# Patient Record
Sex: Female | Born: 1950
Health system: Southern US, Community
[De-identification: ages and names within clinical notes are randomized; demographics above are authoritative.]

## PROBLEM LIST (undated history)

## (undated) DIAGNOSIS — K635 Polyp of colon: Secondary | ICD-10-CM

## (undated) DIAGNOSIS — N9489 Other specified conditions associated with female genital organs and menstrual cycle: Secondary | ICD-10-CM

## (undated) DIAGNOSIS — M8580 Other specified disorders of bone density and structure, unspecified site: Secondary | ICD-10-CM

## (undated) DIAGNOSIS — I10 Essential (primary) hypertension: Secondary | ICD-10-CM

## (undated) DIAGNOSIS — E785 Hyperlipidemia, unspecified: Secondary | ICD-10-CM

## (undated) DIAGNOSIS — M1990 Unspecified osteoarthritis, unspecified site: Secondary | ICD-10-CM

## (undated) DIAGNOSIS — N83201 Unspecified ovarian cyst, right side: Secondary | ICD-10-CM

## (undated) DIAGNOSIS — T8859XA Other complications of anesthesia, initial encounter: Secondary | ICD-10-CM

## (undated) DIAGNOSIS — E119 Type 2 diabetes mellitus without complications: Secondary | ICD-10-CM

## (undated) DIAGNOSIS — I493 Ventricular premature depolarization: Secondary | ICD-10-CM

## (undated) DIAGNOSIS — Z87410 Personal history of cervical dysplasia: Secondary | ICD-10-CM

## (undated) DIAGNOSIS — Z8719 Personal history of other diseases of the digestive system: Secondary | ICD-10-CM

## (undated) DIAGNOSIS — K219 Gastro-esophageal reflux disease without esophagitis: Secondary | ICD-10-CM

## (undated) HISTORY — DX: Ventricular premature depolarization: I49.3

## (undated) HISTORY — DX: Hyperlipidemia, unspecified: E78.5

## (undated) HISTORY — DX: Essential (primary) hypertension: I10

## (undated) HISTORY — PX: CARDIAC ELECTROPHYSIOLOGY STUDY AND ABLATION: SHX1294

## (undated) HISTORY — DX: Personal history of cervical dysplasia: Z87.410

## (undated) HISTORY — DX: Polyp of colon: K63.5

## (undated) HISTORY — PX: APPENDECTOMY: SHX54

## (undated) HISTORY — PX: TONSILLECTOMY: SUR1361

## (undated) HISTORY — DX: Other specified conditions associated with female genital organs and menstrual cycle: N94.89

## (undated) HISTORY — DX: Type 2 diabetes mellitus without complications: E11.9

## (undated) HISTORY — DX: Other specified disorders of bone density and structure, unspecified site: M85.80

---
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
## 1994-01-11 HISTORY — PX: TUBAL LIGATION: SHX77

## 2017-02-21 DIAGNOSIS — E1169 Type 2 diabetes mellitus with other specified complication: Secondary | ICD-10-CM | POA: Diagnosis not present

## 2017-02-21 DIAGNOSIS — E119 Type 2 diabetes mellitus without complications: Secondary | ICD-10-CM | POA: Diagnosis not present

## 2017-02-21 DIAGNOSIS — I10 Essential (primary) hypertension: Secondary | ICD-10-CM | POA: Diagnosis not present

## 2017-06-22 DIAGNOSIS — I10 Essential (primary) hypertension: Secondary | ICD-10-CM | POA: Diagnosis not present

## 2017-06-22 DIAGNOSIS — Z0000 Encounter for general adult medical examination without abnormal findings: Secondary | ICD-10-CM | POA: Diagnosis not present

## 2017-06-22 DIAGNOSIS — E7800 Pure hypercholesterolemia, unspecified: Secondary | ICD-10-CM | POA: Diagnosis not present

## 2017-06-22 DIAGNOSIS — Z23 Encounter for immunization: Secondary | ICD-10-CM | POA: Diagnosis not present

## 2017-06-22 DIAGNOSIS — E1169 Type 2 diabetes mellitus with other specified complication: Secondary | ICD-10-CM | POA: Diagnosis not present

## 2017-06-22 DIAGNOSIS — Z124 Encounter for screening for malignant neoplasm of cervix: Secondary | ICD-10-CM | POA: Diagnosis not present

## 2017-06-24 DIAGNOSIS — Z807 Family history of other malignant neoplasms of lymphoid, hematopoietic and related tissues: Secondary | ICD-10-CM | POA: Diagnosis not present

## 2017-06-24 DIAGNOSIS — R928 Other abnormal and inconclusive findings on diagnostic imaging of breast: Secondary | ICD-10-CM | POA: Diagnosis not present

## 2017-06-24 DIAGNOSIS — Z1231 Encounter for screening mammogram for malignant neoplasm of breast: Secondary | ICD-10-CM | POA: Diagnosis not present

## 2017-06-28 DIAGNOSIS — R928 Other abnormal and inconclusive findings on diagnostic imaging of breast: Secondary | ICD-10-CM | POA: Diagnosis not present

## 2017-09-05 DIAGNOSIS — L255 Unspecified contact dermatitis due to plants, except food: Secondary | ICD-10-CM | POA: Diagnosis not present

## 2017-09-05 DIAGNOSIS — M7071 Other bursitis of hip, right hip: Secondary | ICD-10-CM | POA: Diagnosis not present

## 2017-10-05 DIAGNOSIS — I10 Essential (primary) hypertension: Secondary | ICD-10-CM | POA: Diagnosis not present

## 2017-10-05 DIAGNOSIS — Z23 Encounter for immunization: Secondary | ICD-10-CM | POA: Diagnosis not present

## 2017-10-05 DIAGNOSIS — E785 Hyperlipidemia, unspecified: Secondary | ICD-10-CM | POA: Diagnosis not present

## 2017-10-05 DIAGNOSIS — E1140 Type 2 diabetes mellitus with diabetic neuropathy, unspecified: Secondary | ICD-10-CM | POA: Diagnosis not present

## 2017-12-19 DIAGNOSIS — K219 Gastro-esophageal reflux disease without esophagitis: Secondary | ICD-10-CM | POA: Diagnosis not present

## 2017-12-19 DIAGNOSIS — Z86010 Personal history of colonic polyps: Secondary | ICD-10-CM | POA: Diagnosis not present

## 2017-12-19 DIAGNOSIS — R194 Change in bowel habit: Secondary | ICD-10-CM | POA: Diagnosis not present

## 2017-12-22 DIAGNOSIS — Z86010 Personal history of colonic polyps: Secondary | ICD-10-CM | POA: Diagnosis not present

## 2017-12-22 DIAGNOSIS — R197 Diarrhea, unspecified: Secondary | ICD-10-CM | POA: Diagnosis not present

## 2017-12-22 DIAGNOSIS — D123 Benign neoplasm of transverse colon: Secondary | ICD-10-CM | POA: Diagnosis not present

## 2017-12-22 DIAGNOSIS — K5730 Diverticulosis of large intestine without perforation or abscess without bleeding: Secondary | ICD-10-CM | POA: Diagnosis not present

## 2017-12-22 DIAGNOSIS — R194 Change in bowel habit: Secondary | ICD-10-CM | POA: Diagnosis not present

## 2017-12-27 DIAGNOSIS — D123 Benign neoplasm of transverse colon: Secondary | ICD-10-CM | POA: Diagnosis not present

## 2018-02-06 DIAGNOSIS — D3132 Benign neoplasm of left choroid: Secondary | ICD-10-CM | POA: Diagnosis not present

## 2018-02-06 DIAGNOSIS — H2513 Age-related nuclear cataract, bilateral: Secondary | ICD-10-CM | POA: Diagnosis not present

## 2018-02-06 DIAGNOSIS — E119 Type 2 diabetes mellitus without complications: Secondary | ICD-10-CM | POA: Diagnosis not present

## 2018-02-13 DIAGNOSIS — Z1389 Encounter for screening for other disorder: Secondary | ICD-10-CM | POA: Diagnosis not present

## 2018-02-13 DIAGNOSIS — Z7984 Long term (current) use of oral hypoglycemic drugs: Secondary | ICD-10-CM | POA: Diagnosis not present

## 2018-02-13 DIAGNOSIS — E1169 Type 2 diabetes mellitus with other specified complication: Secondary | ICD-10-CM | POA: Diagnosis not present

## 2018-02-13 DIAGNOSIS — I10 Essential (primary) hypertension: Secondary | ICD-10-CM | POA: Diagnosis not present

## 2018-02-13 DIAGNOSIS — I493 Ventricular premature depolarization: Secondary | ICD-10-CM | POA: Diagnosis not present

## 2018-02-13 DIAGNOSIS — E7800 Pure hypercholesterolemia, unspecified: Secondary | ICD-10-CM | POA: Diagnosis not present

## 2018-02-14 ENCOUNTER — Other Ambulatory Visit: Payer: Self-pay | Admitting: Internal Medicine

## 2018-02-14 DIAGNOSIS — Z1231 Encounter for screening mammogram for malignant neoplasm of breast: Secondary | ICD-10-CM

## 2018-02-20 DIAGNOSIS — R194 Change in bowel habit: Secondary | ICD-10-CM | POA: Diagnosis not present

## 2018-02-20 DIAGNOSIS — Z86010 Personal history of colonic polyps: Secondary | ICD-10-CM | POA: Diagnosis not present

## 2018-06-30 ENCOUNTER — Ambulatory Visit
Admission: RE | Admit: 2018-06-30 | Discharge: 2018-06-30 | Disposition: A | Discharge status: Home / Self Care | Payer: Medicare Other | Source: Ambulatory Visit | Attending: Internal Medicine | Admitting: Internal Medicine

## 2018-06-30 ENCOUNTER — Other Ambulatory Visit: Payer: Self-pay

## 2018-06-30 ENCOUNTER — Other Ambulatory Visit: Payer: Self-pay | Admitting: Obstetrics and Gynecology

## 2018-06-30 ENCOUNTER — Other Ambulatory Visit (HOSPITAL_COMMUNITY)
Admission: RE | Admit: 2018-06-30 | Discharge: 2018-06-30 | Disposition: A | Discharge status: Home / Self Care | Payer: Medicare Other | Source: Ambulatory Visit | Attending: Obstetrics and Gynecology | Admitting: Obstetrics and Gynecology

## 2018-06-30 DIAGNOSIS — Z01419 Encounter for gynecological examination (general) (routine) without abnormal findings: Secondary | ICD-10-CM | POA: Insufficient documentation

## 2018-06-30 DIAGNOSIS — Z1231 Encounter for screening mammogram for malignant neoplasm of breast: Secondary | ICD-10-CM | POA: Diagnosis not present

## 2018-06-30 DIAGNOSIS — N812 Incomplete uterovaginal prolapse: Secondary | ICD-10-CM | POA: Diagnosis not present

## 2018-06-30 DIAGNOSIS — Z87410 Personal history of cervical dysplasia: Secondary | ICD-10-CM | POA: Diagnosis not present

## 2018-07-04 LAB — CYTOLOGY - PAP
Diagnosis: NEGATIVE
HPV: NOT DETECTED

## 2018-08-03 DIAGNOSIS — K219 Gastro-esophageal reflux disease without esophagitis: Secondary | ICD-10-CM | POA: Diagnosis not present

## 2018-08-03 DIAGNOSIS — Z1389 Encounter for screening for other disorder: Secondary | ICD-10-CM | POA: Diagnosis not present

## 2018-08-03 DIAGNOSIS — E7800 Pure hypercholesterolemia, unspecified: Secondary | ICD-10-CM | POA: Diagnosis not present

## 2018-08-03 DIAGNOSIS — E1169 Type 2 diabetes mellitus with other specified complication: Secondary | ICD-10-CM | POA: Diagnosis not present

## 2018-08-03 DIAGNOSIS — Z7984 Long term (current) use of oral hypoglycemic drugs: Secondary | ICD-10-CM | POA: Diagnosis not present

## 2018-08-03 DIAGNOSIS — I10 Essential (primary) hypertension: Secondary | ICD-10-CM | POA: Diagnosis not present

## 2018-08-03 DIAGNOSIS — Z23 Encounter for immunization: Secondary | ICD-10-CM | POA: Diagnosis not present

## 2018-08-03 DIAGNOSIS — Z0000 Encounter for general adult medical examination without abnormal findings: Secondary | ICD-10-CM | POA: Diagnosis not present

## 2018-08-21 DIAGNOSIS — L03116 Cellulitis of left lower limb: Secondary | ICD-10-CM | POA: Diagnosis not present

## 2018-09-06 DIAGNOSIS — L97929 Non-pressure chronic ulcer of unspecified part of left lower leg with unspecified severity: Secondary | ICD-10-CM | POA: Diagnosis not present

## 2018-09-11 DIAGNOSIS — Z23 Encounter for immunization: Secondary | ICD-10-CM | POA: Diagnosis not present

## 2018-09-20 ENCOUNTER — Other Ambulatory Visit: Payer: Self-pay

## 2018-09-20 ENCOUNTER — Encounter (HOSPITAL_BASED_OUTPATIENT_CLINIC_OR_DEPARTMENT_OTHER): Payer: Medicare Other | Attending: Physician Assistant

## 2018-09-20 DIAGNOSIS — E11622 Type 2 diabetes mellitus with other skin ulcer: Secondary | ICD-10-CM | POA: Diagnosis not present

## 2018-09-20 DIAGNOSIS — L97822 Non-pressure chronic ulcer of other part of left lower leg with fat layer exposed: Secondary | ICD-10-CM | POA: Insufficient documentation

## 2018-09-20 DIAGNOSIS — Z7984 Long term (current) use of oral hypoglycemic drugs: Secondary | ICD-10-CM | POA: Diagnosis not present

## 2018-09-20 DIAGNOSIS — Z87891 Personal history of nicotine dependence: Secondary | ICD-10-CM | POA: Insufficient documentation

## 2018-09-20 DIAGNOSIS — L97829 Non-pressure chronic ulcer of other part of left lower leg with unspecified severity: Secondary | ICD-10-CM | POA: Diagnosis not present

## 2018-09-20 DIAGNOSIS — I10 Essential (primary) hypertension: Secondary | ICD-10-CM | POA: Insufficient documentation

## 2018-09-21 ENCOUNTER — Other Ambulatory Visit: Payer: Self-pay

## 2018-09-27 DIAGNOSIS — I10 Essential (primary) hypertension: Secondary | ICD-10-CM | POA: Diagnosis not present

## 2018-09-27 DIAGNOSIS — Z87891 Personal history of nicotine dependence: Secondary | ICD-10-CM | POA: Diagnosis not present

## 2018-09-27 DIAGNOSIS — L97822 Non-pressure chronic ulcer of other part of left lower leg with fat layer exposed: Secondary | ICD-10-CM | POA: Diagnosis not present

## 2018-09-27 DIAGNOSIS — Z7984 Long term (current) use of oral hypoglycemic drugs: Secondary | ICD-10-CM | POA: Diagnosis not present

## 2018-09-27 DIAGNOSIS — L97829 Non-pressure chronic ulcer of other part of left lower leg with unspecified severity: Secondary | ICD-10-CM | POA: Diagnosis not present

## 2018-09-27 DIAGNOSIS — E11622 Type 2 diabetes mellitus with other skin ulcer: Secondary | ICD-10-CM | POA: Diagnosis not present

## 2018-12-21 ENCOUNTER — Other Ambulatory Visit: Payer: Self-pay | Admitting: Internal Medicine

## 2018-12-21 DIAGNOSIS — Z1231 Encounter for screening mammogram for malignant neoplasm of breast: Secondary | ICD-10-CM

## 2019-02-05 ENCOUNTER — Other Ambulatory Visit: Payer: Self-pay | Admitting: Internal Medicine

## 2019-02-05 DIAGNOSIS — I493 Ventricular premature depolarization: Secondary | ICD-10-CM | POA: Diagnosis not present

## 2019-02-05 DIAGNOSIS — M2550 Pain in unspecified joint: Secondary | ICD-10-CM | POA: Diagnosis not present

## 2019-02-05 DIAGNOSIS — E2839 Other primary ovarian failure: Secondary | ICD-10-CM | POA: Diagnosis not present

## 2019-02-05 DIAGNOSIS — E1169 Type 2 diabetes mellitus with other specified complication: Secondary | ICD-10-CM | POA: Diagnosis not present

## 2019-02-05 DIAGNOSIS — E7800 Pure hypercholesterolemia, unspecified: Secondary | ICD-10-CM | POA: Diagnosis not present

## 2019-02-05 DIAGNOSIS — I10 Essential (primary) hypertension: Secondary | ICD-10-CM | POA: Diagnosis not present

## 2019-02-12 DIAGNOSIS — E119 Type 2 diabetes mellitus without complications: Secondary | ICD-10-CM | POA: Diagnosis not present

## 2019-02-12 DIAGNOSIS — D3132 Benign neoplasm of left choroid: Secondary | ICD-10-CM | POA: Diagnosis not present

## 2019-02-12 DIAGNOSIS — H2513 Age-related nuclear cataract, bilateral: Secondary | ICD-10-CM | POA: Diagnosis not present

## 2019-03-30 ENCOUNTER — Other Ambulatory Visit: Payer: Self-pay | Admitting: Internal Medicine

## 2019-03-30 ENCOUNTER — Ambulatory Visit
Admission: RE | Admit: 2019-03-30 | Discharge: 2019-03-30 | Disposition: A | Discharge status: Home / Self Care | Payer: Medicare Other | Source: Ambulatory Visit | Attending: Internal Medicine | Admitting: Internal Medicine

## 2019-03-30 DIAGNOSIS — M25551 Pain in right hip: Secondary | ICD-10-CM | POA: Diagnosis not present

## 2019-03-30 DIAGNOSIS — M25561 Pain in right knee: Secondary | ICD-10-CM

## 2019-03-30 DIAGNOSIS — M1611 Unilateral primary osteoarthritis, right hip: Secondary | ICD-10-CM | POA: Diagnosis not present

## 2019-04-04 ENCOUNTER — Ambulatory Visit (INDEPENDENT_AMBULATORY_CARE_PROVIDER_SITE_OTHER): Payer: Medicare Other | Admitting: Orthopaedic Surgery

## 2019-04-04 ENCOUNTER — Other Ambulatory Visit: Payer: Self-pay

## 2019-04-04 DIAGNOSIS — M25561 Pain in right knee: Secondary | ICD-10-CM

## 2019-04-04 NOTE — Progress Notes (Signed)
Office Visit Note   Patient: Cheryl Hendricks           Date of Birth: 04-06-1950           MRN: [id number] Visit Date: 04/04/2019              Requested by: Wenda Low, MD [facility] [employer] [address] [place],  [place] [address] PCP: Wenda Low, MD   Assessment & Plan: Visit Diagnoses:  1. Acute pain of right knee     Plan: Given her improvement on meloxicam certainly there is an inflammatory process that have developed.  Hopefully this is subsiding for her.  I recommend she continue meloxicam for at least another week.  We had a long thorough discussion about her hip and her knee.  She can also try over-the-counter Voltaren gel.  All question concerns were answered and addressed.  Since she is already doing better follow-up can be as needed.  However, if things worsen anyway I would like to see her again.  Follow-Up Instructions: Return if symptoms worsen or fail to improve.   Orders:  No orders of the defined types were placed in this encounter.  No orders of the defined types were placed in this encounter.     Procedures: No procedures performed   Clinical Data: No additional findings.   Subjective: Chief Complaint  Patient presents with  . Right Hip - Pain  . Right Knee - Pain  The patient is a very pleasant and active 69 year old female referred from Dr. Lysle Rubens to evaluate and treat mainly right knee pain.  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  She developed right knee pain and swelling and some hip pain after that.  Her primary care physician did start her on meloxicam and she said with meloxicam and rest that she has done so much better overall while taking that medication.  She denies any injuries.  She did report significant swelling in her right knee a week ago and was hard even lift her leg into her car.  She reports being a diabetic but under good control.  She is incredibly active.  She denies any  groin pain and points to the lateral aspect of her right hip and IT band is the area of pain as well as global knee pain.  Again these have calmed down since she has been on meloxicam.  HPI  Review of Systems She currently denies any headache, chest pain, cough of breath, fever, chills, nausea, vomiting  Objective: Vital Signs: There were no vitals taken for this visit.  Physical Exam She is alert and orient x3 and in no acute distress Ortho Exam Examination of her right hip shows fluid and full range of motion with only pain of the trochanteric area to deep palpation.  Her right knee exam shows no effusion today.  Her range of motion is full.  The knee is ligamentously stable with a negative Lachman's or McMurray's exam. Specialty Comments:  No specialty comments available.  Imaging: No results found. X-rays independently reviewed of both her right hip and her right knee showed no acute findings.  PMFS History: There are no problems to display for this patient.  No past medical history on file.  No family history on file.   Social History   Occupational History  . Not on file  Tobacco Use  . Smoking status: Not on file  Substance and Sexual  Activity  . Alcohol use: Not on file  . Drug use: Not on file  . Sexual activity: Not on file

## 2019-05-08 ENCOUNTER — Ambulatory Visit (INDEPENDENT_AMBULATORY_CARE_PROVIDER_SITE_OTHER): Payer: Medicare Other | Admitting: Orthopaedic Surgery

## 2019-05-08 ENCOUNTER — Encounter: Payer: Self-pay | Admitting: Orthopaedic Surgery

## 2019-05-08 ENCOUNTER — Other Ambulatory Visit: Payer: Self-pay

## 2019-05-08 DIAGNOSIS — M7061 Trochanteric bursitis, right hip: Secondary | ICD-10-CM

## 2019-05-08 MED ORDER — LIDOCAINE HCL 1 % IJ SOLN
3.0000 mL | INTRAMUSCULAR | Status: AC | PRN
  Administered 2019-05-08: 3 mL

## 2019-05-08 MED ORDER — METHYLPREDNISOLONE ACETATE 40 MG/ML IJ SUSP
40.0000 mg | INTRAMUSCULAR | Status: AC | PRN
  Administered 2019-05-08: 12:00:00 40 mg via INTRA_ARTICULAR

## 2019-05-08 NOTE — Progress Notes (Signed)
   Procedure Note  Patient: Cheryl Hendricks             Date of Birth: March 24, 1950           MRN: [id number]             Visit Date: 05/08/2019  Cheryl Hendricks returns today due to right hip and right knee pain.  Lesser pain is lateral aspect of her right hip.  She denies any radicular symptoms down the leg.  She notes that she took the Mobic for 12 days and it helped and stopped taking it.  She is recently been to [place] and drove down herself and 1 week ago came back from [place] and started developing pain along the IP area.  She did send massaging.  Stretching of the IT band.  She having difficulty walking.  She is diabetic reports good control of her diabetes.  She is returning back to [place] next week will not be returning until May 16 to [place].  Physical exam: Bilateral hips good range of motion.  Right hip pain with external rotation.  Tenderness over the trochanteric region.  No rashes skin lesions over the lateral hip.  Procedures: Visit Diagnoses:  1. Trochanteric bursitis, right hip     Large Joint Inj: R greater trochanter on 05/08/2019 11:47 AM Indications: pain Details: 22 G 1.5 in needle, lateral approach  Arthrogram: No  Medications: 3 mL lidocaine 1 %; 40 mg methylPREDNISolone acetate 40 MG/ML Outcome: tolerated well, no immediate complications Procedure, treatment alternatives, risks and benefits explained, specific risks discussed. Consent was given by the patient. Immediately prior to procedure a time out was called to verify the correct patient, procedure, equipment, support staff and site/side marked as required. Patient was prepped and draped in the usual sterile fashion.     Plan: She shown IT band stretching exercises.  Suggest that she get back on the Mobic.  She will follow-up with [place] on as-needed basis.  Questions were encouraged

## 2019-06-18 ENCOUNTER — Telehealth: Payer: Self-pay | Admitting: Orthopaedic Surgery

## 2019-06-18 ENCOUNTER — Other Ambulatory Visit: Payer: Self-pay

## 2019-06-18 DIAGNOSIS — M545 Low back pain, unspecified: Secondary | ICD-10-CM

## 2019-06-18 NOTE — Telephone Encounter (Signed)
Order sent for PT

## 2019-06-18 NOTE — Telephone Encounter (Signed)
Please advise 

## 2019-06-18 NOTE — Telephone Encounter (Signed)
Patient called stating that she is still taking the anti-inflammatory and is still having trouble with her hip.  She has also looked up on YouTube for some stretching exercises, which she is not sure if she is doing the right ones.  Patient is requesting a referral to PT. [phone].  Thank you.

## 2019-06-18 NOTE — Telephone Encounter (Signed)
Go ahead and refer her to outpatient physical therapy for any modalities to treat trochanteric bursitis per the therapist.

## 2019-07-02 ENCOUNTER — Ambulatory Visit: Payer: Medicare Other | Attending: Orthopaedic Surgery | Admitting: Physical Therapy

## 2019-07-02 ENCOUNTER — Ambulatory Visit
Admission: RE | Admit: 2019-07-02 | Discharge: 2019-07-02 | Disposition: A | Discharge status: Home / Self Care | Payer: Medicare Other | Source: Ambulatory Visit | Attending: Internal Medicine | Admitting: Internal Medicine

## 2019-07-02 ENCOUNTER — Encounter: Payer: Self-pay | Admitting: Physical Therapy

## 2019-07-02 ENCOUNTER — Other Ambulatory Visit: Payer: Self-pay

## 2019-07-02 DIAGNOSIS — M25551 Pain in right hip: Secondary | ICD-10-CM | POA: Diagnosis present

## 2019-07-02 DIAGNOSIS — E2839 Other primary ovarian failure: Secondary | ICD-10-CM

## 2019-07-02 DIAGNOSIS — Z1231 Encounter for screening mammogram for malignant neoplasm of breast: Secondary | ICD-10-CM

## 2019-07-02 DIAGNOSIS — R2689 Other abnormalities of gait and mobility: Secondary | ICD-10-CM | POA: Diagnosis present

## 2019-07-02 DIAGNOSIS — M545 Low back pain, unspecified: Secondary | ICD-10-CM

## 2019-07-02 DIAGNOSIS — M6281 Muscle weakness (generalized): Secondary | ICD-10-CM

## 2019-07-02 DIAGNOSIS — G8929 Other chronic pain: Secondary | ICD-10-CM | POA: Diagnosis present

## 2019-07-02 NOTE — Therapy (Signed)
[place], [place], [address] Phone: [phone]   Fax:  [phone]  Physical Therapy Evaluation  Patient Details  Name: Cheryl Hendricks MRN: [id number] Date of Birth: 12/11/1950 Referring Provider (PT): Jean Rosenthal MD   Encounter Date: 07/02/2019   PT End of Session - 07/02/19 1617    Visit Number 1    Number of Visits 13    Date for PT Re-Evaluation 08/13/19    Authorization Type MCR: Kx at mod at 15th visit    Progress Note Due on Visit 10    PT Start Time 1414    PT Stop Time 1458    PT Time Calculation (min) 44 min    Activity Tolerance Patient tolerated treatment well    Behavior During Therapy [facility] for tasks assessed/performed           Past Medical History:  Diagnosis Date  . Diabetes mellitus without complication [facility])     Past Surgical History:  Procedure Laterality Date  . OVARIAN CYST REMOVAL  1973  . TUBAL LIGATION  1996    There were no vitals filed for this visit.    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 she reports falling a few years ago which she reported is a precursor to this issue she is dealing with in the hip. She reports pain whil occasionally fluctuate from the front to the back of the R hip that fluctueasts in severity. she reports getting an inject in her R lateral hip with no improvement of pain.    Limitations Standing    How long can you sit comfortably? unlimited    How long can you stand comfortably? unlimited    How long can you walk comfortably? unlimited    Diagnostic tests N/A    Patient Stated Goals to decrease the pain,    Currently in Pain? Yes    Pain Score 2    at worst 6/10   Pain Location Hip    Pain Orientation  Right;Anterior;Posterior    Pain Descriptors / Indicators Aching;Sore    Pain Type Chronic pain    Pain Onset More than a month ago    Pain Frequency Intermittent    Aggravating Factors  prolonged standing, getting out fo the car    Pain Relieving Factors medication              [facility] PT Assessment - 07/02/19 1415      Assessment   Medical Diagnosis  Low back pain, unspecified back pain laterality, unspecified chronicity, unspecified whether sciatica present , R hip pain    Referring Provider (PT) Jean Rosenthal MD    Onset Date/Surgical Date --   started R posterior hip 2016, recent exacerbation 2021   Hand Dominance Right    Next MD Visit make on PRN    Prior Therapy yes      Precautions   Precautions None      Restrictions   Weight Bearing Restrictions No      Balance Screen   Has the patient fallen in the past 6 months No    Has the patient had a decrease in activity level because of a fear of falling?  No  Is the patient reluctant to leave their home because of a fear of falling?  No      Home Ecologist residence      Prior Function   Level of Independence Independent with basic ADLs    Vocation Retired      Associate Professor   Overall Cognitive Status Within Functional Limits for tasks assessed      Observation/Other Assessments   Focus on Therapeutic Outcomes (FOTO)  back 37% limited, hip 34% limited   predicted back 32% limited, hip 29% limited     Posture/Postural Control   Posture/Postural Control Postural limitations    Postural Limitations Rounded Shoulders;Forward head      ROM / Strength   AROM / PROM / Strength AROM;Strength      AROM   Overall AROM  Within functional limits for tasks performed    Overall AROM Comments hip IR / ER limited to due to pain IR 10 degrees ER 14 with increased femoral acetabular pain    AROM Assessment Site Hip;Lumbar    Right/Left Hip Right;Left    Lumbar Flexion 100    Lumbar  Extension 28    Lumbar - Right Side Bend 10    Lumbar - Left Side Bend 15      Strength   Strength Assessment Site Hip;Knee    Right/Left Hip Right;Left    Right Hip Flexion 4-/5    Right Hip Extension 3+/5    Right Hip ABduction 4-/5    Right Hip ADduction 4-/5    Left Hip Flexion 4/5    Left Hip Extension 4-/5    Right/Left Knee Right;Left    Right Knee Flexion 4-/5    Right Knee Extension 4-/5    Left Knee Flexion 4+/5    Left Knee Extension 4+/5      Palpation   Palpation comment TTP along the rectus femoris and iliopsoas pain, along the femoral accetabular jiont, mild tenderness along greater trochanter      Special Tests    Special Tests Hip Special Tests    Hip Special Tests  Hip Scouring;Other;Anterior Hip Impingement Test;Patrick (FABER) Test      Cheryl Hendricks [facility]) Test   Findings Positive    Side Right   with limited ROM and increased pain     Hip Scouring   Findings Positive    Side Right      Anterior Hip Impingement Test    Findings Positive    Side  Right      other   Findings Positive    Side Right    Comments reduced pain with LAD      Ambulation/Gait   Ambulation/Gait Yes    Gait Pattern Step-through pattern;Decreased stride length;Decreased stance time - right;Decreased step length - left;Trendelenburg;Antalgic                      Objective measurements completed on examination: See above findings.       [facility] Adult PT Treatment/Exercise - 07/02/19 1415      Exercises   Exercises Knee/Hip;Lumbar      Knee/Hip Exercises: Stretches   Hip Flexor Stretch 2 reps;Right;30 seconds      Manual Therapy   Manual therapy comments LAD grade IV                  PT Education - 07/02/19 1514    Education Details evaluation findings, POC, goals, HEP with proper form / rationale.  Person(s) Educated Patient    Methods Explanation;Verbal cues;Handout    Comprehension Verbal cues required;Verbalized understanding             PT Short Term Goals - 07/02/19 1442      PT SHORT TERM GOAL #1   Title pt to be I with inital HEp    Time 3    Period Weeks    Status New    Target Date 07/23/19             PT Long Term Goals - 07/02/19 1605      PT LONG TERM GOAL #1   Title increase R hip IR/ER by >/= 8 degrees with  </= 2/10 pain for functional hip mobiltiy    Time 6    Period Weeks    Status New    Target Date 08/13/19      PT LONG TERM GOAL #2   Title increase gross R hip strength to >/= 4+/5 to promote stability with walking/ standing    Time 6    Period Weeks    Status New    Target Date 08/13/19      PT LONG TERM GOAL #3   Title pt to verbalize/ demo efficient gait pattern with </= 2/10 pain    Time 6    Period Weeks    Status New    Target Date 08/13/19      PT LONG TERM GOAL #4   Title increase FOTO score for back to </=32% limited and hip to </= 29% limited to demo functional improvement    Time 6    Period Weeks    Status New    Target Date 08/13/19      PT LONG TERM GOAL #5   Title pt to be I with all HEP given to maintain and progress current level of function    Time 6    Period Weeks    Status New    Target Date 08/13/19                  Plan - 07/02/19 1439    Clinical Impression Statement pt presents to [facility] with CC or R hip/ low back pain starting initally after a fall back in 2016 injuring her R hip and recent exacerbation that started after prolonged standing at the vaccination clinic [facility] the [facility] in March 2021. She demonstrates functoinal hip ROM excepr for IR/ER secondary to pain located at the femoroacetabular joint and special testing is consistent with suggesting high liklihood of hip pathology. She has limited R hip strength and exhibits an antalgic gait pattern with limited stance on the RL and stride on the LLE. She would benefit from physical therapy to decrease R hp pain, improve ROM, increase gait efficency and maximize her function by addressing  the deficits listed.    Personal Factors and Comorbidities Comorbidity 1;Age    Comorbidities hx of DM    Examination-Activity Limitations Stand    Stability/Clinical Decision Making Evolving/Moderate complexity    Clinical Decision Making Moderate    Rehab Potential Good    PT Frequency 2x / week    PT Duration 6 weeks    PT Treatment/Interventions Contrast Bath;ADLs/Self Care Home Management;Cryotherapy;Electrical Stimulation;Iontophoresis 4mg /ml Dexamethasone;Moist Heat;Ultrasound;Traction;Functional mobility training;Therapeutic activities;Therapeutic exercise;Balance training;Neuromuscular re-education;Patient/family education;Manual techniques;Passive range of motion;Dry needling;Taping    PT Next Visit Plan review/ update HEP PRN, review and print FOTO handouts, hip mobs/ LAD, STW along rectus femoris, gross hip strengthening  PT Home Exercise Plan [id number] -  supine marching, sidelying hip abduction, lower trunk rotatin, (supine/ standing )hip flexor stretch    Consulted and Agree with Plan of Care Patient           Patient will benefit from skilled therapeutic intervention in order to improve the following deficits and impairments:  Improper body mechanics, Increased muscle spasms, Decreased strength, Pain, Postural dysfunction, Decreased activity tolerance, Decreased endurance, Decreased range of motion, Abnormal gait  Visit Diagnosis: Pain in right hip  Muscle weakness (generalized)  Chronic low back pain, unspecified back pain laterality, unspecified whether sciatica present  Other abnormalities of gait and mobility     Problem List There are no problems to display for this patient.  Cheryl Hendricks PT, DPT, LAT, ATC  07/02/19  4:19 PM      [facility] [address] [place], [place], [address] Phone: [phone]   Fax:  [phone]  Name: Cheryl Hendricks MRN: [id number] Date of Birth: Aug 28, 1950

## 2019-07-03 ENCOUNTER — Other Ambulatory Visit: Payer: Medicare Other

## 2019-07-03 ENCOUNTER — Ambulatory Visit: Payer: Medicare Other | Admitting: Physical Therapy

## 2019-07-05 ENCOUNTER — Ambulatory Visit: Payer: Medicare Other | Admitting: Physical Therapy

## 2019-07-05 ENCOUNTER — Other Ambulatory Visit: Payer: Self-pay

## 2019-07-05 ENCOUNTER — Encounter: Payer: Self-pay | Admitting: Physical Therapy

## 2019-07-05 DIAGNOSIS — M25551 Pain in right hip: Secondary | ICD-10-CM | POA: Diagnosis not present

## 2019-07-05 DIAGNOSIS — M545 Low back pain, unspecified: Secondary | ICD-10-CM

## 2019-07-05 DIAGNOSIS — M6281 Muscle weakness (generalized): Secondary | ICD-10-CM

## 2019-07-05 DIAGNOSIS — R2689 Other abnormalities of gait and mobility: Secondary | ICD-10-CM

## 2019-07-05 NOTE — Therapy (Signed)
[place], [place], [address] Phone: [phone]   Fax:  [phone]  Physical Therapy Treatment  Patient Details  Name: Cheryl Hendricks MRN: [id number] Date of Birth: December 12, 1950 Referring Provider (PT): Jean Rosenthal MD   Encounter Date: 07/05/2019   PT End of Session - 07/05/19 1550    Visit Number 2    Number of Visits 13    Date for PT Re-Evaluation 08/13/19    Authorization Type MCR: Kx at mod at 15th visit    PT Start Time 1547    PT Stop Time 1630    PT Time Calculation (min) 43 min    Activity Tolerance Patient tolerated treatment well    Behavior During Therapy [facility] for tasks assessed/performed           Past Medical History:  Diagnosis Date  . Diabetes mellitus without complication [facility])     Past Surgical History:  Procedure Laterality Date  . OVARIAN CYST REMOVAL  1973  . TUBAL LIGATION  1996    There were no vitals filed for this visit.   Subjective Assessment - 07/05/19 1550    Subjective My muscles are screaming- points to Rt buttock area. I may have overdone the stretches or am strething things that haven't been stretches for a while.                             [facility] Adult PT Treatment/Exercise - 07/05/19 0001      Knee/Hip Exercises: Stretches   Piriformis Stretch Limitations release on 1/2 foam roll 2 min each    Other Knee/Hip Stretches SKTC with pull to shoulder, 2 min each      Manual Therapy   Manual Therapy Joint mobilization;Soft tissue mobilization    Manual therapy comments iso press into ADD followed by passive ROM into ADD    Joint Mobilization seated Lt upper quadrant sacrum PA 2 min, seated L5 Rt SB & rot with mini push ups    Soft tissue mobilization IASTM Rt quads                  PT Education - 07/05/19 1758    Education Details shoes, anatomy of condition, gait pattern, review of history    Person(s) Educated Patient     Methods Explanation    Comprehension Verbalized understanding;Need further instruction            PT Short Term Goals - 07/02/19 1442      PT SHORT TERM GOAL #1   Title pt to be I with inital HEp    Time 3    Period Weeks    Status New    Target Date 07/23/19             PT Long Term Goals - 07/02/19 1605      PT LONG TERM GOAL #1   Title increase R hip IR/ER by >/= 8 degrees with  </= 2/10 pain for functional hip mobiltiy    Time 6    Period Weeks    Status New    Target Date 08/13/19      PT LONG TERM GOAL #2   Title increase gross R hip strength to >/= 4+/5 to promote stability with walking/ standing    Time 6    Period Weeks    Status New    Target Date 08/13/19      PT LONG  TERM GOAL #3   Title pt to verbalize/ demo efficient gait pattern with </= 2/10 pain    Time 6    Period Weeks    Status New    Target Date 08/13/19      PT LONG TERM GOAL #4   Title increase FOTO score for back to </=32% limited and hip to </= 29% limited to demo functional improvement    Time 6    Period Weeks    Status New    Target Date 08/13/19      PT LONG TERM GOAL #5   Title pt to be I with all HEP given to maintain and progress current level of function    Time 6    Period Weeks    Status New    Target Date 08/13/19                 Plan - 07/05/19 1755    Clinical Impression Statement Limited spring in Lt SIJ and Lt rotation of L5 corrected today which improved proximal pain but cont to have pain in distal quads and deep in FA joint in WB. Activated adductors to release tension from abductors. encouraged her to perform trunk rotation in her gait to reduce sidebend.    PT Treatment/Interventions Contrast Bath;ADLs/Self Care Home Management;Cryotherapy;Electrical Stimulation;Iontophoresis 4mg /ml Dexamethasone;Moist Heat;Ultrasound;Traction;Functional mobility training;Therapeutic activities;Therapeutic exercise;Balance training;Neuromuscular  re-education;Patient/family education;Manual techniques;Passive range of motion;Dry needling;Taping    PT Next Visit Plan cont STW in quads & consider hip abductors, improve weight shift tolerance & Lt oblique activation for upright posture    PT Home Exercise Plan [id number] -  supine marching, sidelying hip abduction, lower trunk rotatin, (supine/ standing )hip flexor stretch, towel roll piriformis release    Consulted and Agree with Plan of Care Patient           Patient will benefit from skilled therapeutic intervention in order to improve the following deficits and impairments:  Improper body mechanics, Increased muscle spasms, Decreased strength, Pain, Postural dysfunction, Decreased activity tolerance, Decreased endurance, Decreased range of motion, Abnormal gait  Visit Diagnosis: Pain in right hip  Muscle weakness (generalized)  Chronic low back pain, unspecified back pain laterality, unspecified whether sciatica present  Other abnormalities of gait and mobility     Problem List There are no problems to display for this patient.   Deeann Servidio C. Eustacio Ellen PT, DPT 07/05/19 5:59 PM   [place], [place], [address] Phone: [phone]   Fax:  [phone]  Name: Cheryl Hendricks MRN: [id number] Date of Birth: 1950-01-25

## 2019-07-09 ENCOUNTER — Other Ambulatory Visit: Payer: Self-pay

## 2019-07-09 ENCOUNTER — Ambulatory Visit: Payer: Medicare Other | Admitting: Physical Therapy

## 2019-07-09 ENCOUNTER — Encounter: Payer: Self-pay | Admitting: Physical Therapy

## 2019-07-09 DIAGNOSIS — M6281 Muscle weakness (generalized): Secondary | ICD-10-CM

## 2019-07-09 DIAGNOSIS — M25551 Pain in right hip: Secondary | ICD-10-CM | POA: Diagnosis not present

## 2019-07-09 DIAGNOSIS — G8929 Other chronic pain: Secondary | ICD-10-CM

## 2019-07-09 DIAGNOSIS — R2689 Other abnormalities of gait and mobility: Secondary | ICD-10-CM

## 2019-07-09 NOTE — Therapy (Signed)
[place], [place], [address] Phone: [phone]   Fax:  [phone]  Physical Therapy Treatment  Patient Details  Name: Cheryl Hendricks MRN: [id number] Date of Birth: [birth year] Referring Provider (PT): Jean Rosenthal MD   Encounter Date: 07/09/2019   PT End of Session - 07/09/19 1548    Visit Number 3    Number of Visits 13    Date for PT Re-Evaluation 08/13/19    Authorization Type MCR: Kx at mod at 15th visit    Progress Note Due on Visit 10    PT Start Time 1548    PT Stop Time 0430    PT Time Calculation (min) 762 min    Activity Tolerance Patient tolerated treatment well    Behavior During Therapy [facility] for tasks assessed/performed           Past Medical History:  Diagnosis Date  . Diabetes mellitus without complication [facility])     Past Surgical History:  Procedure Laterality Date  . OVARIAN CYST REMOVAL  1973  . TUBAL LIGATION  1996    There were no vitals filed for this visit.   Subjective Assessment - 07/09/19 1549    Subjective " the pressure point treatment she did the last session helped. I was pretty good on Friday and Saturday i was back [place]"    Currently in Pain? Yes    Pain Score 2     Pain Location Hip    Pain Orientation Right;Anterior;Posterior    Pain Type Chronic pain    Pain Onset More than a month ago    Pain Frequency Intermittent    Aggravating Factors  standing on concrete for long periods, getting out of the car    Pain Relieving Factors medication              [facility] PT Assessment - 07/09/19 0001      Assessment   Medical Diagnosis  Low back pain, unspecified back pain laterality, unspecified chronicity, unspecified whether sciatica present , R hip pain    Referring Provider (PT) Jean Rosenthal MD                         [facility] Adult PT Treatment/Exercise - 07/09/19 0001      Knee/Hip Exercises:  Standing   Hip Abduction 2 sets;15 reps;Knee straight    Gait Training heel strike/ toe off with reciprocal arm swing 4 x 10 ft      Knee/Hip Exercises: Seated   Sit to Sand 1 set;10 reps   with LLE advanced forward slightly     Knee/Hip Exercises: Supine   Straight Leg Raises 2 sets;10 reps;Strengthening;Right      Manual Therapy   Manual Therapy Other (comment)    Manual therapy comments MTPR along proximal rectus femoris and glute medius x 2 ea.    Joint Mobilization LAD grade IV RLE    Soft tissue mobilization IASTM Rt quads                    PT Short Term Goals - 07/02/19 1442      PT SHORT TERM GOAL #1   Title pt to be I with inital HEp    Time 3    Period Weeks    Status New    Target Date 07/23/19             PT Long  Term Goals - 07/02/19 1605      PT LONG TERM GOAL #1   Title increase R hip IR/ER by >/= 8 degrees with  </= 2/10 pain for functional hip mobiltiy    Time 6    Period Weeks    Status New    Target Date 08/13/19      PT LONG TERM GOAL #2   Title increase gross R hip strength to >/= 4+/5 to promote stability with walking/ standing    Time 6    Period Weeks    Status New    Target Date 08/13/19      PT LONG TERM GOAL #3   Title pt to verbalize/ demo efficient gait pattern with </= 2/10 pain    Time 6    Period Weeks    Status New    Target Date 08/13/19      PT LONG TERM GOAL #4   Title increase FOTO score for back to </=32% limited and hip to </= 29% limited to demo functional improvement    Time 6    Period Weeks    Status New    Target Date 08/13/19      PT LONG TERM GOAL #5   Title pt to be I with all HEP given to maintain and progress current level of function    Time 6    Period Weeks    Status New    Target Date 08/13/19                 Plan - 07/09/19 1552    Clinical Impression Statement pt reports improvement of back soreness and noted feeling looser following previous session. Cotninued trigger  point release along the rectus femoris and glute med followed with hip mobs. she responded well to hip strengthening, continued working on gait training with arm swing/ heel strike/ toe off to reduce antalgic pattern.    PT Treatment/Interventions Contrast Bath;ADLs/Self Care Home Management;Cryotherapy;Electrical Stimulation;Iontophoresis 4mg /ml Dexamethasone;Moist Heat;Ultrasound;Traction;Functional mobility training;Therapeutic activities;Therapeutic exercise;Balance training;Neuromuscular re-education;Patient/family education;Manual techniques;Passive range of motion;Dry needling;Taping    PT Next Visit Plan cont STW in quads & consider hip abductors, improve weight shift tolerance & Lt oblique activation for upright posture    PT Home Exercise Plan [id number] -  supine marching, sidelying hip abduction, lower trunk rotatin, (supine/ standing )hip flexor stretch, towel roll piriformis release, heel strike/ toe off pattern    Consulted and Agree with Plan of Care Patient           Patient will benefit from skilled therapeutic intervention in order to improve the following deficits and impairments:  Improper body mechanics, Increased muscle spasms, Decreased strength, Pain, Postural dysfunction, Decreased activity tolerance, Decreased endurance, Decreased range of motion, Abnormal gait  Visit Diagnosis: Pain in right hip  Muscle weakness (generalized)  Chronic low back pain, unspecified back pain laterality, unspecified whether sciatica present  Other abnormalities of gait and mobility     Problem List There are no problems to display for this patient.  [name] PT, DPT, LAT, ATC  07/09/19  5:38 PM      [facility] [address] [place], [place], [address] Phone: [phone]   Fax:  [phone]  Name: Cheryl Hendricks MRN: [id number] Date of Birth: [birth year]

## 2019-07-11 ENCOUNTER — Ambulatory Visit: Payer: Medicare Other | Admitting: Physical Therapy

## 2019-07-11 ENCOUNTER — Other Ambulatory Visit: Payer: Self-pay

## 2019-07-11 DIAGNOSIS — M25551 Pain in right hip: Secondary | ICD-10-CM | POA: Diagnosis not present

## 2019-07-11 DIAGNOSIS — G8929 Other chronic pain: Secondary | ICD-10-CM

## 2019-07-11 DIAGNOSIS — R2689 Other abnormalities of gait and mobility: Secondary | ICD-10-CM

## 2019-07-11 DIAGNOSIS — M6281 Muscle weakness (generalized): Secondary | ICD-10-CM

## 2019-07-11 NOTE — Therapy (Signed)
[place], [place], [address] Phone: [phone]   Fax:  [phone]  Physical Therapy Treatment  Patient Details  Name: Cheryl Hendricks MRN: [id number] Date of Birth: February 28, 1950 Referring Provider (PT): Jean Rosenthal MD   Encounter Date: 07/11/2019   PT End of Session - 07/11/19 1026    Visit Number 4    Number of Visits 13    Date for PT Re-Evaluation 08/13/19    Authorization Type MCR: Kx at mod at 15th visit    Progress Note Due on Visit 10    PT Start Time 1026   pt arrived 11 min late   PT Stop Time 1100    PT Time Calculation (min) 34 min    Activity Tolerance Patient tolerated treatment well    Behavior During Therapy [facility] for tasks assessed/performed           Past Medical History:  Diagnosis Date  . Diabetes mellitus without complication [facility])     Past Surgical History:  Procedure Laterality Date  . OVARIAN CYST REMOVAL  1973  . TUBAL LIGATION  1996    There were no vitals filed for this visit.   Subjective Assessment - 07/11/19 1027    Subjective "I am feeling good today. I think the one pulling my leg up to the side is improving."              [facility] PT Assessment - 07/11/19 0001      Assessment   Medical Diagnosis  Low back pain, unspecified back pain laterality, unspecified chronicity, unspecified whether sciatica present , R hip pain    Referring Provider (PT) Jean Rosenthal MD                         [facility] Adult PT Treatment/Exercise - 07/11/19 0001      Lumbar Exercises: Aerobic   Nustep L6 x 6 min LE only      Knee/Hip Exercises: Stretches   Hip Flexor Stretch 2 reps;Right;30 seconds      Knee/Hip Exercises: Standing   Hip Abduction 2 sets;15 reps;Knee straight    Gait Training heel strike/ toe off with reciprocal arm swing 4 x 10 ft   with use of trekking poles     Manual Therapy   Manual therapy comments MTPR along proximal rectus  femoris and glute medius x 2 ea.    Joint Mobilization LAD grade IV RLE, hip AP mobs grade III    Soft tissue mobilization DTM along proximal quads and hip flexors                    PT Short Term Goals - 07/02/19 1442      PT SHORT TERM GOAL #1   Title pt to be I with inital HEp    Time 3    Period Weeks    Status New    Target Date 07/23/19             PT Long Term Goals - 07/02/19 1605      PT LONG TERM GOAL #1   Title increase R hip IR/ER by >/= 8 degrees with  </= 2/10 pain for functional hip mobiltiy    Time 6    Period Weeks    Status New    Target Date 08/13/19      PT LONG TERM GOAL #2   Title increase gross R hip strength to >/=  4+/5 to promote stability with walking/ standing    Time 6    Period Weeks    Status New    Target Date 08/13/19      PT LONG TERM GOAL #3   Title pt to verbalize/ demo efficient gait pattern with </= 2/10 pain    Time 6    Period Weeks    Status New    Target Date 08/13/19      PT LONG TERM GOAL #4   Title increase FOTO score for back to </=32% limited and hip to </= 29% limited to demo functional improvement    Time 6    Period Weeks    Status New    Target Date 08/13/19      PT LONG TERM GOAL #5   Title pt to be I with all HEP given to maintain and progress current level of function    Time 6    Period Weeks    Status New    Target Date 08/13/19                 Plan - 07/11/19 1108    Clinical Impression Statement pt arrived 11 min late today, and reports she feels she is getting strong in the hip abductors, but conitnues to demosntrates sorenss inthe proximal hip today. Continued STW along the proximal hip flexors followed with stretching. focused remainder of session on gait training using trekking poles to promote arm swing and trunk rotation which she demonstated improvement with. without the poles she exhibits increased R lateral sway to compensate for trendelenberg pattern.    PT  Treatment/Interventions Contrast Bath;ADLs/Self Care Home Management;Cryotherapy;Electrical Stimulation;Iontophoresis 4mg /ml Dexamethasone;Moist Heat;Ultrasound;Traction;Functional mobility training;Therapeutic activities;Therapeutic exercise;Balance training;Neuromuscular re-education;Patient/family education;Manual techniques;Passive range of motion;Dry needling;Taping    PT Next Visit Plan cont STW in quads & consider hip abductors, improve weight shift tolerance & Lt oblique activation for upright posture, assess leg length. continued gait training, assess leg length    PT Home Exercise Plan [id number] -  supine marching, sidelying hip abduction, lower trunk rotatin, (supine/ standing )hip flexor stretch, towel roll piriformis release, heel strike/ toe off pattern    Consulted and Agree with Plan of Care Patient           Patient will benefit from skilled therapeutic intervention in order to improve the following deficits and impairments:  Improper body mechanics, Increased muscle spasms, Decreased strength, Pain, Postural dysfunction, Decreased activity tolerance, Decreased endurance, Decreased range of motion, Abnormal gait  Visit Diagnosis: Pain in right hip  Muscle weakness (generalized)  Chronic low back pain, unspecified back pain laterality, unspecified whether sciatica present  Other abnormalities of gait and mobility     Problem List There are no problems to display for this patient.  [name] PT, DPT, LAT, ATC  07/11/19  12:41 PM      [facility] [address] [place], [place], [address] Phone: [phone]   Fax:  [phone]  Name: Mersedes Alber MRN: [id number] Date of Birth: Apr 23, 1950

## 2019-07-17 ENCOUNTER — Other Ambulatory Visit: Payer: Self-pay

## 2019-07-17 ENCOUNTER — Ambulatory Visit: Payer: Medicare Other | Attending: Orthopaedic Surgery | Admitting: Physical Therapy

## 2019-07-17 ENCOUNTER — Encounter: Payer: Self-pay | Admitting: Physical Therapy

## 2019-07-17 DIAGNOSIS — R2689 Other abnormalities of gait and mobility: Secondary | ICD-10-CM | POA: Insufficient documentation

## 2019-07-17 DIAGNOSIS — M6281 Muscle weakness (generalized): Secondary | ICD-10-CM | POA: Diagnosis not present

## 2019-07-17 DIAGNOSIS — M545 Low back pain: Secondary | ICD-10-CM | POA: Diagnosis not present

## 2019-07-17 DIAGNOSIS — M25551 Pain in right hip: Secondary | ICD-10-CM | POA: Diagnosis not present

## 2019-07-17 DIAGNOSIS — G8929 Other chronic pain: Secondary | ICD-10-CM | POA: Diagnosis not present

## 2019-07-17 NOTE — Therapy (Signed)
[place], [place], [address] Phone: [phone]   Fax:  [phone]  Physical Therapy Treatment  Patient Details  Name: Cheryl Hendricks MRN: [id number] Date of Birth: [birth year] Referring Provider (PT): Jean Rosenthal MD   Encounter Date: 07/17/2019   PT End of Session - 07/17/19 1511    Visit Number 5    Number of Visits 13    Date for PT Re-Evaluation 08/13/19    Authorization Type MCR: Kx at mod at 15th visit    PT Start Time 1420    PT Stop Time 1515    PT Time Calculation (min) 55 min    Activity Tolerance Patient tolerated treatment well    Behavior During Therapy [facility] for tasks assessed/performed           Past Medical History:  Diagnosis Date  . Diabetes mellitus without complication [facility])     Past Surgical History:  Procedure Laterality Date  . OVARIAN CYST REMOVAL  1973  . TUBAL LIGATION  1996    There were no vitals filed for this visit.   Subjective Assessment - 07/17/19 1425    Subjective I had a lazy holiday weekend. Have not been very active over the last few days. Having a hard time doing heel strike.    Currently in Pain? No/denies                             [facility] Adult PT Treatment/Exercise - 07/17/19 0001      Lumbar Exercises: Stretches   Single Knee to Chest Stretch Limitations supine knee to shoulder, 2 min ea    Other Lumbar Stretch Exercise prone sheet roll under Lt ASIS 2 min      Manual Therapy   Manual therapy comments supine hip 90 to fallout adductor stretch 3x2 min    Joint Mobilization prone Lt upper quadrant SIJ, FA joint distraction in hooklying    Soft tissue mobilization Lt piriformis, Rt adductors                  PT Education - 07/17/19 1515    Education Details osteopenia, requests for f/u with ortho MD, anatomy of condition, Gait eval & discussion    Person(s) Educated Patient    Methods Explanation     Comprehension Verbalized understanding;Need further instruction            PT Short Term Goals - 07/02/19 1442      PT SHORT TERM GOAL #1   Title pt to be I with inital HEp    Time 3    Period Weeks    Status New    Target Date 07/23/19             PT Long Term Goals - 07/02/19 1605      PT LONG TERM GOAL #1   Title increase R hip IR/ER by >/= 8 degrees with  </= 2/10 pain for functional hip mobiltiy    Time 6    Period Weeks    Status New    Target Date 08/13/19      PT LONG TERM GOAL #2   Title increase gross R hip strength to >/= 4+/5 to promote stability with walking/ standing    Time 6    Period Weeks    Status New    Target Date 08/13/19      PT LONG TERM GOAL #3  Title pt to verbalize/ demo efficient gait pattern with </= 2/10 pain    Time 6    Period Weeks    Status New    Target Date 08/13/19      PT LONG TERM GOAL #4   Title increase FOTO score for back to </=32% limited and hip to </= 29% limited to demo functional improvement    Time 6    Period Weeks    Status New    Target Date 08/13/19      PT LONG TERM GOAL #5   Title pt to be I with all HEP given to maintain and progress current level of function    Time 6    Period Weeks    Status New    Target Date 08/13/19                 Plan - 07/17/19 1517    Clinical Impression Statement Pt presented with LLD (Rt longer) but we were able to correct this with pelvic spring treatments as well as Rt Adductor stretching indicating functional LLD. regardless of these treatments, pt continues to demo antalgic gait on Rt hip. She reports that her Dexa scan showed mild osteopenia which has not been the case in the past. I recommended that she obtain images of her hip from the past to see if there has been progression.    PT Treatment/Interventions Contrast Bath;ADLs/Self Care Home Management;Cryotherapy;Electrical Stimulation;Iontophoresis 4mg /ml Dexamethasone;Moist  Heat;Ultrasound;Traction;Functional mobility training;Therapeutic activities;Therapeutic exercise;Balance training;Neuromuscular re-education;Patient/family education;Manual techniques;Passive range of motion;Dry needling;Taping    PT Next Visit Plan consider DN to adductors, glut strength into hip ext    PT Home Exercise Plan [id number] -  supine marching, sidelying hip abduction, lower trunk rotatin, (supine/ standing )hip flexor stretch, towel roll piriformis release, heel strike/ toe off pattern, hip flx to 90+abd stretch    Consulted and Agree with Plan of Care Patient           Patient will benefit from skilled therapeutic intervention in order to improve the following deficits and impairments:  Improper body mechanics, Increased muscle spasms, Decreased strength, Pain, Postural dysfunction, Decreased activity tolerance, Decreased endurance, Decreased range of motion, Abnormal gait  Visit Diagnosis: Pain in right hip  Muscle weakness (generalized)     Problem List There are no problems to display for this patient.  Ta Fair C. Kristan Brummitt PT, DPT 07/17/19 3:27 PM   [place] [facility] [address] [place], [place], [address] Phone: [phone]   Fax:  [phone]  Name: Cheryl Hendricks MRN: [id number] Date of Birth: 01-29-1950

## 2019-07-19 ENCOUNTER — Encounter: Payer: Self-pay | Admitting: Physical Therapy

## 2019-07-19 ENCOUNTER — Other Ambulatory Visit: Payer: Self-pay

## 2019-07-19 ENCOUNTER — Ambulatory Visit: Payer: Medicare Other | Admitting: Physical Therapy

## 2019-07-19 DIAGNOSIS — M545 Low back pain: Secondary | ICD-10-CM | POA: Diagnosis not present

## 2019-07-19 DIAGNOSIS — M6281 Muscle weakness (generalized): Secondary | ICD-10-CM | POA: Diagnosis not present

## 2019-07-19 DIAGNOSIS — G8929 Other chronic pain: Secondary | ICD-10-CM | POA: Diagnosis not present

## 2019-07-19 DIAGNOSIS — M25551 Pain in right hip: Secondary | ICD-10-CM

## 2019-07-19 DIAGNOSIS — R2689 Other abnormalities of gait and mobility: Secondary | ICD-10-CM | POA: Diagnosis not present

## 2019-07-19 NOTE — Therapy (Signed)
[place], [place], [address] Phone: [phone]   Fax:  [phone]  Physical Therapy Treatment  Patient Details  Name: Cheryl Hendricks MRN: [id number] Date of Birth: 10/18/1950 Referring Provider (PT): Jean Rosenthal MD   Encounter Date: 07/19/2019   PT End of Session - 07/19/19 1427    Visit Number 6    Number of Visits 13    Date for PT Re-Evaluation 08/13/19    Authorization Type MCR: Kx at mod at 15th visit    PT Start Time 1426   pt arrived late   PT Stop Time 1458    PT Time Calculation (min) 32 min    Activity Tolerance Patient tolerated treatment well    Behavior During Therapy [facility] for tasks assessed/performed           Past Medical History:  Diagnosis Date  . Diabetes mellitus without complication [facility])     Past Surgical History:  Procedure Laterality Date  . OVARIAN CYST REMOVAL  1973  . TUBAL LIGATION  1996    There were no vitals filed for this visit.   Subjective Assessment - 07/19/19 1427    Subjective Feeling pretty good. Felt loose Tuesday and yesterday.    Currently in Pain? No/denies              [facility] PT Assessment - 07/19/19 0001      Assessment   Next MD Visit 7/20                         [facility] Adult PT Treatment/Exercise - 07/19/19 0001      Manual Therapy   Manual therapy comments manual adductor stretching 3x3 min, manual IR stretch in supine-foot anchored 3x3 min    Joint Mobilization lateral distraction of Rt hip in hooklying                    PT Short Term Goals - 07/02/19 1442      PT SHORT TERM GOAL #1   Title pt to be I with inital HEp    Time 3    Period Weeks    Status New    Target Date 07/23/19             PT Long Term Goals - 07/02/19 1605      PT LONG TERM GOAL #1   Title increase R hip IR/ER by >/= 8 degrees with  </= 2/10 pain for functional hip mobiltiy    Time 6    Period Weeks    Status New     Target Date 08/13/19      PT LONG TERM GOAL #2   Title increase gross R hip strength to >/= 4+/5 to promote stability with walking/ standing    Time 6    Period Weeks    Status New    Target Date 08/13/19      PT LONG TERM GOAL #3   Title pt to verbalize/ demo efficient gait pattern with </= 2/10 pain    Time 6    Period Weeks    Status New    Target Date 08/13/19      PT LONG TERM GOAL #4   Title increase FOTO score for back to </=32% limited and hip to </= 29% limited to demo functional improvement    Time 6    Period Weeks    Status New    Target  Date 08/13/19      PT LONG TERM GOAL #5   Title pt to be I with all HEP given to maintain and progress current level of function    Time 6    Period Weeks    Status New    Target Date 08/13/19                 Plan - 07/19/19 1500    Clinical Impression Statement Limited flexibility into ER and IR of hip joint. Able to stretch effectively without pinching or pain with multiple cues to relax. Utilized low load-long duration stretching today with breathing to relax.    PT Treatment/Interventions Contrast Bath;ADLs/Self Care Home Management;Cryotherapy;Electrical Stimulation;Iontophoresis 4mg /ml Dexamethasone;Moist Heat;Ultrasound;Traction;Functional mobility training;Therapeutic activities;Therapeutic exercise;Balance training;Neuromuscular re-education;Patient/family education;Manual techniques;Passive range of motion;Dry needling;Taping    PT Next Visit Plan continue stretching, glut max strengthening for upright posture    PT Home Exercise Plan [id number] -  supine marching, sidelying hip abduction, lower trunk rotatin, (supine/ standing )hip flexor stretch, towel roll piriformis release, heel strike/ toe off pattern, hip flx to 90+abd stretch, supine IR stretch foot anchored    Consulted and Agree with Plan of Care Patient           Patient will benefit from skilled therapeutic intervention in order to improve the  following deficits and impairments:  Improper body mechanics, Increased muscle spasms, Decreased strength, Pain, Postural dysfunction, Decreased activity tolerance, Decreased endurance, Decreased range of motion, Abnormal gait  Visit Diagnosis: Pain in right hip  Muscle weakness (generalized)     Problem List There are no problems to display for this patient.  Cheryl Hendricks C. Cheryl Hendricks PT, DPT 07/19/19 3:02 PM   [facility] [address] [place], [place], [address] Phone: [phone]   Fax:  [phone]  Name: Cheryl Hendricks MRN: [id number] Date of Birth: 1950-06-20

## 2019-07-23 ENCOUNTER — Ambulatory Visit: Payer: Medicare Other | Admitting: Physical Therapy

## 2019-07-23 ENCOUNTER — Other Ambulatory Visit: Payer: Self-pay

## 2019-07-23 ENCOUNTER — Encounter: Payer: Self-pay | Admitting: Physical Therapy

## 2019-07-23 DIAGNOSIS — M25551 Pain in right hip: Secondary | ICD-10-CM | POA: Diagnosis not present

## 2019-07-23 DIAGNOSIS — G8929 Other chronic pain: Secondary | ICD-10-CM | POA: Diagnosis not present

## 2019-07-23 DIAGNOSIS — R2689 Other abnormalities of gait and mobility: Secondary | ICD-10-CM

## 2019-07-23 DIAGNOSIS — M6281 Muscle weakness (generalized): Secondary | ICD-10-CM

## 2019-07-23 DIAGNOSIS — M545 Low back pain, unspecified: Secondary | ICD-10-CM

## 2019-07-23 NOTE — Therapy (Signed)
[place], [place], [address] Phone: [phone]   Fax:  [phone]  Physical Therapy Treatment  Patient Details  Name: Cheryl Hendricks MRN: [id number] Date of Birth: November 06, 1950 Referring Provider (PT): Jean Rosenthal MD   Encounter Date: 07/23/2019   PT End of Session - 07/23/19 1104    Visit Number 7    Number of Visits 13    Date for PT Re-Evaluation 08/13/19    Authorization Type MCR: Kx at mod at 15th visit    Progress Note Due on Visit 10    PT Start Time 1054   patient arrived late   PT Stop Time 1145    PT Time Calculation (min) 51 min    Activity Tolerance Patient tolerated treatment well    Behavior During Therapy [facility] for tasks assessed/performed           Past Medical History:  Diagnosis Date   Diabetes mellitus without complication ([place])     Past Surgical History:  Procedure Laterality Date   [place]    There were no vitals filed for this visit.   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 She feels that her limping has become a habit. 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    Patient Stated Goals to decrease the pain,    Currently in Pain? Yes    Pain Score 2     Pain Location Hip    Pain Orientation Right    Pain Descriptors / Indicators Aching;Sore    Pain Type Chronic pain    Pain Onset More than a month ago    Pain Frequency Intermittent    Aggravating Factors  standing on hard floors for extended periods              [facility] PT Assessment - 07/23/19 0001      Assessment   Medical Diagnosis  Low back pain, unspecified back pain laterality,  unspecified chronicity, unspecified whether sciatica present , R hip pain    Referring Provider (PT) Jean Rosenthal MD    Next MD Visit 07/31/2019      Precautions   Precautions None      Restrictions   Weight Bearing Restrictions No      Balance Screen   Has the patient fallen in the past 6 months No      Strength   Right Hip Extension 3+/5    Left Hip Extension 4-/5                         [facility] Adult PT Treatment/Exercise - 07/23/19 0001      Exercises   Exercises Knee/Hip;Lumbar      Lumbar Exercises: Stretches   Hip Flexor Stretch 3 reps;30 seconds    Hip Flexor Stretch Limitations supine edge of table with towel    Piriformis Stretch Limitations Trialed piriformis stretching and figure-4 stretching but patient reported anterior hip and thigh pain so d/c'd      Lumbar Exercises: Aerobic   Nustep L5 x 5 min (LE only)      Lumbar Exercises: Supine  Clam 10 reps   2 sets   Clam Limitations green band around knees, cues for PPT and controlled movement, patient exhibited difficulty controlling pelvis    Bridge 10 reps;3 seconds   2 sets   Bridge Limitations added green band around knees for second set to encourage greater gluteal engagement because she was reporting more quad working      Lumbar Exercises: Quadruped   Other Quadruped Lumbar Exercises Quadruped hip extension (donkey kick) 2x10 - cued to keep core engaged and avoid rotating pelvis                  PT Education - 07/23/19 1103    Education Details HEP    Person(s) Educated Patient    Methods Explanation;Verbal cues    Comprehension Verbalized understanding;Returned demonstration;Verbal cues required;Need further instruction            PT Short Term Goals - 07/23/19 1202      PT SHORT TERM GOAL #1   Title pt to be I with inital HEp    Time 3    Period Weeks    Status On-going    Target Date 07/23/19             PT Long Term Goals - 07/02/19 1605      PT LONG  TERM GOAL #1   Title increase R hip IR/ER by >/= 8 degrees with  </= 2/10 pain for functional hip mobiltiy    Time 6    Period Weeks    Status New    Target Date 08/13/19      PT LONG TERM GOAL #2   Title increase gross R hip strength to >/= 4+/5 to promote stability with walking/ standing    Time 6    Period Weeks    Status New    Target Date 08/13/19      PT LONG TERM GOAL #3   Title pt to verbalize/ demo efficient gait pattern with </= 2/10 pain    Time 6    Period Weeks    Status New    Target Date 08/13/19      PT LONG TERM GOAL #4   Title increase FOTO score for back to </=32% limited and hip to </= 29% limited to demo functional improvement    Time 6    Period Weeks    Status New    Target Date 08/13/19      PT LONG TERM GOAL #5   Title pt to be I with all HEP given to maintain and progress current level of function    Time 6    Period Weeks    Status New    Target Date 08/13/19                 Plan - 07/23/19 1104    Clinical Impression Statement Patient tolerated therapy well with no adverse effects. She reported anterior hip/thigh pain with piriformis stretching and was unable to modify the stretches to reduce this so d/c'd stretch but encouraged patient to continue these at home as tolerated. She did demonstrate difficulty engaging R>L glutes so incorporated gluteal/core strengthening with good tolerance. She would benefit from continued skilled PT to progress her hip mobility and strength to reduce pain with extended periods of standing and walking.    PT Treatment/Interventions Contrast Bath;ADLs/Self Care Home Management;Cryotherapy;Electrical Stimulation;Iontophoresis 4mg /ml Dexamethasone;Moist Heat;Ultrasound;Traction;Functional mobility training;Therapeutic activities;Therapeutic exercise;Balance training;Neuromuscular re-education;Patient/family education;Manual techniques;Passive range of motion;Dry needling;Taping    PT  Next Visit Plan Assess  response to update HEP, continue glute strengthening, possibly try pilates for core/hip strengthening    PT Home Exercise Plan [id number] -  supine marching, sidelying hip abduction, lower trunk rotatin, (supine/ standing) hip flexor stretch with strap, towel roll piriformis release, heel strike/ toe off pattern, hip flx to 90+abd stretch, supine IR stretch foot anchored, bridge with green band on heels, hooklying clamshell with green, quadruped donkey kick    Consulted and Agree with Plan of Care Patient           Patient will benefit from skilled therapeutic intervention in order to improve the following deficits and impairments:  Improper body mechanics, Increased muscle spasms, Decreased strength, Pain, Postural dysfunction, Decreased activity tolerance, Decreased endurance, Decreased range of motion, Abnormal gait  Visit Diagnosis: Pain in right hip  Muscle weakness (generalized)  Chronic low back pain, unspecified back pain laterality, unspecified whether sciatica present  Other abnormalities of gait and mobility     Problem List There are no problems to display for this patient.   Hilda Blades, PT, DPT, LAT, ATC 07/23/19  12:09 PM Phone: [phone] Fax: [facility] [address] [place], [place], [address] Phone: [phone]   Fax:  [phone]  Name: Cheryl Hendricks MRN: [id number] Date of Birth: November 13, 1950

## 2019-07-25 ENCOUNTER — Ambulatory Visit: Payer: Medicare Other | Admitting: Physician Assistant

## 2019-07-26 ENCOUNTER — Ambulatory Visit: Payer: Medicare Other | Admitting: Physical Therapy

## 2019-07-26 ENCOUNTER — Other Ambulatory Visit: Payer: Self-pay

## 2019-07-26 DIAGNOSIS — R2689 Other abnormalities of gait and mobility: Secondary | ICD-10-CM | POA: Diagnosis not present

## 2019-07-26 DIAGNOSIS — M545 Low back pain, unspecified: Secondary | ICD-10-CM

## 2019-07-26 DIAGNOSIS — M6281 Muscle weakness (generalized): Secondary | ICD-10-CM

## 2019-07-26 DIAGNOSIS — G8929 Other chronic pain: Secondary | ICD-10-CM | POA: Diagnosis not present

## 2019-07-26 DIAGNOSIS — M25551 Pain in right hip: Secondary | ICD-10-CM

## 2019-07-26 NOTE — Therapy (Signed)
[place], [place], [address] Phone: [phone]   Fax:  [phone]  Physical Therapy Treatment  Patient Details  Name: Cheryl Hendricks MRN: [id number] Date of Birth: 17-Jan-1950 Referring Provider (PT): Jean Rosenthal MD   Encounter Date: 07/26/2019   PT End of Session - 07/26/19 1116    Visit Number 8    Number of Visits 13    Date for PT Re-Evaluation 08/13/19    Authorization Type MCR: Kx at mod at 15th visit    Progress Note Due on Visit 10    PT Start Time 1111   pt arrived 11 min late today   PT Stop Time 1144    PT Time Calculation (min) 33 min    Activity Tolerance Patient tolerated treatment well    Behavior During Therapy [facility] for tasks assessed/performed           Past Medical History:  Diagnosis Date  . Diabetes mellitus without complication [facility])     Past Surgical History:  Procedure Laterality Date  . OVARIAN CYST REMOVAL  1973  . TUBAL LIGATION  1996    There were no vitals filed for this visit.   Subjective Assessment - 07/26/19 1113    Subjective "I am doing pretty good, still getting some pain with walking. I am following up with my MD regarding the dexa scan results."    Currently in Pain? Yes    Pain Score 2     Pain Location Hip    Pain Orientation Right    Pain Descriptors / Indicators Aching    Pain Type Chronic pain    Pain Onset More than a month ago    Pain Frequency Intermittent    Aggravating Factors  prolonged standing    Pain Relieving Factors mediction              [facility] PT Assessment - 07/26/19 0001      Assessment   Medical Diagnosis  Low back pain, unspecified back pain laterality, unspecified chronicity, unspecified whether sciatica present , R hip pain    Referring Provider (PT) Jean Rosenthal MD      Observation/Other Assessments   Focus on Therapeutic Outcomes (FOTO)  back 33% lmited, and hip was 33% limited                          [facility] Adult PT Treatment/Exercise - 07/26/19 0001      Lumbar Exercises: Stretches   Hip Flexor Stretch 2 reps;30 seconds      Knee/Hip Exercises: Seated   Sit to Sand 3 sets;10 reps   with green theraband around the knees. with LLE advanced ant     Knee/Hip Exercises: Sidelying   Hip ABduction Strengthening;Right;10 reps;3 sets   progressing to clam shell working to fatigue for endurance                 PT Education - 07/26/19 1136    Education Details reviewed standing position and to avoid shifting weight lateraly especailly with standing for long periods of time, and effects on the glute med weaking due to lengthened and shortening of the hip adductors. Reviewed FOTO reassessment    Person(s) Educated Patient    Methods Explanation;Verbal cues    Comprehension Verbalized understanding;Verbal cues required            PT Short Term Goals - 07/26/19 1144      PT SHORT TERM  GOAL #1   Title pt to be I with inital HEp    Period Weeks    Status Achieved             PT Long Term Goals - 07/02/19 1605      PT LONG TERM GOAL #1   Title increase R hip IR/ER by >/= 8 degrees with  </= 2/10 pain for functional hip mobiltiy    Time 6    Period Weeks    Status New    Target Date 08/13/19      PT LONG TERM GOAL #2   Title increase gross R hip strength to >/= 4+/5 to promote stability with walking/ standing    Time 6    Period Weeks    Status New    Target Date 08/13/19      PT LONG TERM GOAL #3   Title pt to verbalize/ demo efficient gait pattern with </= 2/10 pain    Time 6    Period Weeks    Status New    Target Date 08/13/19      PT LONG TERM GOAL #4   Title increase FOTO score for back to </=32% limited and hip to </= 29% limited to demo functional improvement    Time 6    Period Weeks    Status New    Target Date 08/13/19      PT LONG TERM GOAL #5   Title pt to be I with all HEP given to maintain and progress current  level of function    Time 6    Period Weeks    Status New    Target Date 08/13/19                 Plan - 07/26/19 1120    Clinical Impression Statement performed subjective information and FOTO reassessment in stand which pt does exhibit increased R lateral weight shift while standing which could contribute to pain and weakness of that hip 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 she is making progress regarding her FOTO reassessment scores. Continued focus of glute med/ max strengthening working into fatigue.    PT Treatment/Interventions Contrast Bath;ADLs/Self Care Home Management;Cryotherapy;Electrical Stimulation;Iontophoresis 4mg /ml Dexamethasone;Moist Heat;Ultrasound;Traction;Functional mobility training;Therapeutic activities;Therapeutic exercise;Balance training;Neuromuscular re-education;Patient/family education;Manual techniques;Passive range of motion;Dry needling;Taping    PT Next Visit Plan Assess response to update HEP, continue glute strengthening, core/hip strengthening, Reformer    PT Home Exercise Plan [id number] -  supine marching, sidelying hip abduction, lower trunk rotatin, (supine/ standing) hip flexor stretch with strap, towel roll piriformis release, heel strike/ toe off pattern, hip flx to 90+abd stretch, supine IR stretch foot anchored, bridge with green band on heels, hooklying clamshell with green, quadruped donkey kick    Consulted and Agree with Plan of Care Patient           Patient will benefit from skilled therapeutic intervention in order to improve the following deficits and impairments:  Improper body mechanics, Increased muscle spasms, Decreased strength, Pain, Postural dysfunction, Decreased activity tolerance, Decreased endurance, Decreased range of motion, Abnormal gait  Visit Diagnosis: Pain in right hip  Muscle weakness (generalized)  Chronic low back pain, unspecified back pain laterality,  unspecified whether sciatica present  Other abnormalities of gait and mobility     Problem List There are no problems to display for this patient.  Maryjean Corpening PT, DPT, LAT, ATC  07/26/19  12:38 PM  [place], [place], [address] Phone: [phone]   Fax:  [phone]  Name: Cheryl Hendricks MRN: [id number] Date of Birth: 1950-11-07

## 2019-07-30 ENCOUNTER — Other Ambulatory Visit: Payer: Self-pay

## 2019-07-30 ENCOUNTER — Encounter: Payer: Self-pay | Admitting: Physical Therapy

## 2019-07-30 ENCOUNTER — Ambulatory Visit: Payer: Medicare Other | Admitting: Physical Therapy

## 2019-07-30 DIAGNOSIS — M25551 Pain in right hip: Secondary | ICD-10-CM | POA: Diagnosis not present

## 2019-07-30 DIAGNOSIS — R2689 Other abnormalities of gait and mobility: Secondary | ICD-10-CM

## 2019-07-30 DIAGNOSIS — M6281 Muscle weakness (generalized): Secondary | ICD-10-CM | POA: Diagnosis not present

## 2019-07-30 DIAGNOSIS — M545 Low back pain, unspecified: Secondary | ICD-10-CM

## 2019-07-30 DIAGNOSIS — G8929 Other chronic pain: Secondary | ICD-10-CM | POA: Diagnosis not present

## 2019-07-30 NOTE — Therapy (Signed)
[place], [place], [address] Phone: [phone]   Fax:  [phone]  Physical Therapy Treatment Progress Note Reporting Period 07/02/2019 to 07/30/2019  See note below for Objective Data and Assessment of Progress/Goals.       Patient Details  Name: Cheryl Hendricks MRN: [id number] Date of Birth: 1950/01/22 Referring Provider (PT): Jean Rosenthal MD   Encounter Date: 07/30/2019   PT End of Session - 07/30/19 1332    Visit Number 9    Number of Visits 13    Date for PT Re-Evaluation 08/13/19    Authorization Type MCR: Kx at mod at 15th visit    Progress Note Due on Visit 19    PT Start Time 1332    PT Stop Time 1414    PT Time Calculation (min) 42 min    Activity Tolerance Patient tolerated treatment well    Behavior During Therapy [facility] for tasks assessed/performed           Past Medical History:  Diagnosis Date  . Diabetes mellitus without complication [facility])     Past Surgical History:  Procedure Laterality Date  . OVARIAN CYST REMOVAL  1973  . TUBAL LIGATION  1996    There were no vitals filed for this visit.   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 I still feel the pain with walking/ standing especially with standing on the RLE."    Currently in Pain? Yes    Pain Score 2     Pain Location Hip    Pain Orientation Right    Pain Descriptors / Indicators Aching    Pain Onset More than a month ago    Aggravating Factors  prolonged standing/ walking    Pain Relieving Factors medication              [facility] PT Assessment - 07/30/19 0001      Assessment   Medical Diagnosis  Low back pain, unspecified back pain laterality, unspecified chronicity, unspecified whether sciatica present , R hip pain    Referring Provider (PT) Jean Rosenthal MD      AROM    Right Hip External Rotation  23    Right Hip Internal Rotation  30      Strength   Right Hip Flexion 4-/5    Right Hip Extension 3+/5    Right Hip External Rotation  4/5    Right Hip Internal Rotation 4/5    Right Hip ABduction 3+/5    Right Hip ADduction 4/5                         [facility] Adult PT Treatment/Exercise - 07/30/19 0001      Pilates   Pilates Reformer table top position with UE circles 2 x 10 with 1 yellow, 1 red,  standing RLE posterior leg press standing LLE on box 1 x going to fatigue with 1 red    Other Pilates leg press 2 x 15 with red, green and blue, foot work with 2 x 15 w      Knee/Hip Exercises: Standing   Other Standing Knee Exercises hip hikes 2 x 10 in R SLS    Other Standing Knee Exercises L hip against a ball on the wall with LLE advanced foward and dowel rod  in LUE for stability, pressing L hip into ball 2 x 20   for glue med activation                   PT Short Term Goals - 07/26/19 1144      PT SHORT TERM GOAL #1   Title pt to be I with inital HEp    Period Weeks    Status Achieved             PT Long Term Goals - 07/30/19 1336      PT LONG TERM GOAL #1   Title increase R hip IR/ER by >/= 8 degrees with  </= 2/10 pain for functional hip mobiltiy    Baseline met ROM goal, but still has pain that increased with activity    Period Weeks    Status On-going      PT LONG TERM GOAL #2   Title increase gross R hip strength to >/= 4+/5 to promote stability with walking/ standing    Period Weeks    Status On-going      PT LONG TERM GOAL #3   Title pt to verbalize/ demo efficient gait pattern with </= 2/10 pain    Period Weeks    Status On-going      PT LONG TERM GOAL #4   Title increase FOTO score for back to </=32% limited and hip to </= 29% limited to demo functional improvement    Period Weeks    Status On-going      PT LONG TERM GOAL #5   Title pt to be I with all HEP given to maintain and progress current  level of function    Period Weeks    Status On-going                 Plan - 07/30/19 1420    Clinical Impression Statement pt is making progress with physical therapy increasing hip ROM. She continues to report pain in the R hip with increases with prolonged walking/ standing and demonstrates and antalgic gait pattern with L lateral sway as a compensation measure for continued gross hip weakness. She is making progress toward her long term goals. She would benefit from continued physical therapy to decreased R hip pain, increase strength and maximize function by addressing the deficits listed.    PT Treatment/Interventions Contrast Bath;ADLs/Self Care Home Management;Cryotherapy;Electrical Stimulation;Iontophoresis 90m/ml Dexamethasone;Moist Heat;Ultrasound;Traction;Functional mobility training;Therapeutic activities;Therapeutic exercise;Balance training;Neuromuscular re-education;Patient/family education;Manual techniques;Passive range of motion;Dry needling;Taping    PT Next Visit Plan Assess response to update HEP, continue glute strengthening, core/hip strengthening, response Reformer    PT Home Exercise Plan [id number] -  supine marching, sidelying hip abduction, lower trunk rotatin, (supine/ standing) hip flexor stretch with strap, towel roll piriformis release, heel strike/ toe off pattern, hip flx to 90+abd stretch, supine IR stretch foot anchored, bridge with green band on heels, hooklying clamshell with green, quadruped donkey kick    Consulted and Agree with Plan of Care Patient           Patient will benefit from skilled therapeutic intervention in order to improve the following deficits and impairments:  Improper body mechanics, Increased muscle spasms, Decreased strength, Pain, Postural dysfunction, Decreased activity tolerance, Decreased endurance, Decreased range of motion, Abnormal gait  Visit Diagnosis: Pain in right hip  Muscle weakness (generalized)  Chronic low back  pain, unspecified back pain laterality, unspecified whether sciatica present  Other abnormalities of gait and mobility     Problem List  There are no problems to display for this patient.  [name] PT, DPT, LAT, ATC  07/30/19  2:42 PM      [facility] [address] [place], [place], [address] Phone: [phone]   Fax:  [phone]  Name: Cheryl Hendricks MRN: [id number] Date of Birth: 06/28/1950

## 2019-07-31 ENCOUNTER — Ambulatory Visit (INDEPENDENT_AMBULATORY_CARE_PROVIDER_SITE_OTHER): Payer: Medicare Other | Admitting: Orthopaedic Surgery

## 2019-07-31 ENCOUNTER — Other Ambulatory Visit: Payer: Self-pay | Admitting: Radiology

## 2019-07-31 ENCOUNTER — Encounter: Payer: Self-pay | Admitting: Orthopaedic Surgery

## 2019-07-31 VITALS — Ht 67.0 in | Wt 180.0 lb

## 2019-07-31 DIAGNOSIS — M7061 Trochanteric bursitis, right hip: Secondary | ICD-10-CM

## 2019-07-31 NOTE — Progress Notes (Signed)
The patient is well-known to me.  She is a very active individual who has been dealing with significant right hip pain for many years now.  Her last steroid injection of the trochanteric area did not really help much.  She is been going through extensive physical therapy and even have Pilates.  She still has the consistent pain over her trochanteric area but is some of this in the groin as well in the right side.  X-rays from earlier this year showed a well-maintained hip joint with no significant findings.  On exam today she is still consistent with significant pain over the trochanteric area of her right hip on my exam.  There is also pain in the groin at the extremes of internal and external rotation.  This point given the failure of multiple attempts that getting this better with activity modification, anti-inflammatories, steroid injections and physical therapy, a MRI of the right hip is warranted to assess the cartilage around the hip itself as well as any of the structures around the trochanteric area that can be the source of her debilitating pain.  Even evaluating her gait shows she has a significant limp now on this right side which is worrisome.  All question concerns were answered addressed.  We will work on getting this MRI scheduled of the right hip.

## 2019-08-01 ENCOUNTER — Ambulatory Visit
Admission: RE | Admit: 2019-08-01 | Discharge: 2019-08-01 | Disposition: A | Discharge status: Home / Self Care | Payer: Medicare Other | Source: Ambulatory Visit | Attending: Orthopaedic Surgery | Admitting: Orthopaedic Surgery

## 2019-08-01 ENCOUNTER — Ambulatory Visit: Payer: Medicare Other | Admitting: Physical Therapy

## 2019-08-01 ENCOUNTER — Other Ambulatory Visit: Payer: Self-pay

## 2019-08-01 DIAGNOSIS — M6281 Muscle weakness (generalized): Secondary | ICD-10-CM | POA: Diagnosis not present

## 2019-08-01 DIAGNOSIS — M25551 Pain in right hip: Secondary | ICD-10-CM

## 2019-08-01 DIAGNOSIS — M545 Low back pain: Secondary | ICD-10-CM | POA: Diagnosis not present

## 2019-08-01 DIAGNOSIS — M7061 Trochanteric bursitis, right hip: Secondary | ICD-10-CM

## 2019-08-01 DIAGNOSIS — R2689 Other abnormalities of gait and mobility: Secondary | ICD-10-CM

## 2019-08-01 DIAGNOSIS — G8929 Other chronic pain: Secondary | ICD-10-CM | POA: Diagnosis not present

## 2019-08-01 NOTE — Therapy (Signed)
[place], [place], [address] Phone: [phone]   Fax:  [phone]  Physical Therapy Treatment  Patient Details  Name: Cheryl Hendricks MRN: [id number] Date of Birth: 1950-12-23 Referring Provider (PT): Jean Rosenthal MD   Encounter Date: 08/01/2019   PT End of Session - 08/01/19 1319    Visit Number 10    Number of Visits 13    Date for PT Re-Evaluation 08/13/19    Authorization Type MCR: Kx at mod at 15th visit    Progress Note Due on Visit 19    PT Start Time 1330    PT Stop Time 1415    PT Time Calculation (min) 45 min    Activity Tolerance Patient tolerated treatment well    Behavior During Therapy [facility] for tasks assessed/performed           Past Medical History:  Diagnosis Date   Diabetes mellitus without complication ([place])     Past Surgical History:  Procedure Laterality Date   [place]    There were no vitals filed for this visit.   Subjective Assessment - 08/01/19 1329    Subjective " I saw the MD yesterday and had an MRI today. I wasn't able to do my exercises yesterday. I feel like I am doing better today pain is only 1-2/10"    Patient Stated Goals to decrease the pain,    Currently in Pain? Yes              [facility] PT Assessment - 08/01/19 0001      Assessment   Medical Diagnosis  Low back pain, unspecified back pain laterality, unspecified chronicity, unspecified whether sciatica present , R hip pain    Referring Provider (PT) Jean Rosenthal MD      Special Tests    Special Tests Leg LengthTest    Leg length test  True;Apparent      True   Right 92.2 in.    Left  91.3 in.      Apparent   Right 103.7 in.    Left 104 in.                         [facility] Adult PT Treatment/Exercise - 08/01/19 0001      Lumbar Exercises: Aerobic   Nustep L5 x 7 min (LE only)      Lumbar Exercises: Supine   Bridge  15 reps   with heels on red physioball     Lumbar Exercises: Sidelying   Hip Abduction Right;15 reps   2 x circles CW/CCW     Manual Therapy   Manual therapy comments MTPR along the glute med x 3    Soft tissue mobilization IASTM along glute med                   PT Education - 08/01/19 1411    Education Details todays assessment regarding LLD and to bring shoes in next session to place heel lift in L shoe.            PT Short Term Goals - 07/26/19 1144      PT SHORT TERM GOAL #1   Title pt to be I with inital HEp    Period Weeks    Status Achieved             PT Long Term Goals - 07/30/19 1336  PT LONG TERM GOAL #1   Title increase R hip IR/ER by >/= 8 degrees with  </= 2/10 pain for functional hip mobiltiy    Baseline met ROM goal, but still has pain that increased with activity    Period Weeks    Status On-going      PT LONG TERM GOAL #2   Title increase gross R hip strength to >/= 4+/5 to promote stability with walking/ standing    Period Weeks    Status On-going      PT LONG TERM GOAL #3   Title pt to verbalize/ demo efficient gait pattern with </= 2/10 pain    Period Weeks    Status On-going      PT LONG TERM GOAL #4   Title increase FOTO score for back to </=32% limited and hip to </= 29% limited to demo functional improvement    Period Weeks    Status On-going      PT LONG TERM GOAL #5   Title pt to be I with all HEP given to maintain and progress current level of function    Period Weeks    Status On-going                 Plan - 08/01/19 1401    Clinical Impression Statement pt reports pain at only 1-2/10 today. pt notes seeing her MD yesterday and that she was able to get an MRI today and is waiting to hear back from them. measuered hip ROM which she demonstrated a true leg length discrepancy with the RLE being longer. continued working on hip knee strengthening which she did well with. plan to continue with hip strengthening  to promote    PT Treatment/Interventions Contrast Bath;ADLs/Self Care Home Management;Cryotherapy;Electrical Stimulation;Iontophoresis 8m/ml Dexamethasone;Moist Heat;Ultrasound;Traction;Functional mobility training;Therapeutic activities;Therapeutic exercise;Balance training;Neuromuscular re-education;Patient/family education;Manual techniques;Passive range of motion;Dry needling;Taping    PT Next Visit Plan Assess response to update HEP, continue glute strengthening, core/hip strengthening, response Reformer, place heel lift in the L shoe    PT [id number]-  supine marching, sidelying hip abduction, lower trunk rotatin, (supine/ standing) hip flexor stretch with strap, towel roll piriformis release, heel strike/ toe off pattern, hip flx to 90+abd stretch, supine IR stretch foot anchored, bridge with green band on heels, hooklying clamshell with green, quadruped donkey kick    Consulted and Agree with Plan of Care Patient           Patient will benefit from skilled therapeutic intervention in order to improve the following deficits and impairments:  Improper body mechanics, Increased muscle spasms, Decreased strength, Pain, Postural dysfunction, Decreased activity tolerance, Decreased endurance, Decreased range of motion, Abnormal gait  Visit Diagnosis: Pain in right hip  Muscle weakness (generalized)  Chronic low back pain, unspecified back pain laterality, unspecified whether sciatica present  Other abnormalities of gait and mobility     Problem List There are no problems to display for this patient.   KStarr LakePT, DPT, LAT, ATC  08/01/19  2:34 PM      CG[facility] Center14[address] Popl[address]GSouthampton Meadows N[place] [address]Phone: [phone]  Fax:  [phone] Name: MDawnelle WarmanMRN: [id number] of Birth: 2Dec 17, 1952

## 2019-08-06 ENCOUNTER — Encounter: Payer: Medicare Other | Admitting: Physical Therapy

## 2019-08-06 DIAGNOSIS — E559 Vitamin D deficiency, unspecified: Secondary | ICD-10-CM | POA: Diagnosis not present

## 2019-08-06 DIAGNOSIS — Z1389 Encounter for screening for other disorder: Secondary | ICD-10-CM | POA: Diagnosis not present

## 2019-08-06 DIAGNOSIS — I493 Ventricular premature depolarization: Secondary | ICD-10-CM | POA: Diagnosis not present

## 2019-08-06 DIAGNOSIS — E1169 Type 2 diabetes mellitus with other specified complication: Secondary | ICD-10-CM | POA: Diagnosis not present

## 2019-08-06 DIAGNOSIS — I10 Essential (primary) hypertension: Secondary | ICD-10-CM | POA: Diagnosis not present

## 2019-08-06 DIAGNOSIS — K219 Gastro-esophageal reflux disease without esophagitis: Secondary | ICD-10-CM | POA: Diagnosis not present

## 2019-08-06 DIAGNOSIS — Z0000 Encounter for general adult medical examination without abnormal findings: Secondary | ICD-10-CM | POA: Diagnosis not present

## 2019-08-06 DIAGNOSIS — E7800 Pure hypercholesterolemia, unspecified: Secondary | ICD-10-CM | POA: Diagnosis not present

## 2019-08-06 DIAGNOSIS — M8580 Other specified disorders of bone density and structure, unspecified site: Secondary | ICD-10-CM | POA: Diagnosis not present

## 2019-08-06 DIAGNOSIS — Z23 Encounter for immunization: Secondary | ICD-10-CM | POA: Diagnosis not present

## 2019-08-08 ENCOUNTER — Other Ambulatory Visit: Payer: Self-pay

## 2019-08-08 ENCOUNTER — Encounter: Payer: Self-pay | Admitting: Physical Therapy

## 2019-08-08 ENCOUNTER — Ambulatory Visit: Payer: Medicare Other | Admitting: Physical Therapy

## 2019-08-08 DIAGNOSIS — M6281 Muscle weakness (generalized): Secondary | ICD-10-CM | POA: Diagnosis not present

## 2019-08-08 DIAGNOSIS — R2689 Other abnormalities of gait and mobility: Secondary | ICD-10-CM

## 2019-08-08 DIAGNOSIS — M545 Low back pain, unspecified: Secondary | ICD-10-CM

## 2019-08-08 DIAGNOSIS — M25551 Pain in right hip: Secondary | ICD-10-CM | POA: Diagnosis not present

## 2019-08-08 DIAGNOSIS — G8929 Other chronic pain: Secondary | ICD-10-CM | POA: Diagnosis not present

## 2019-08-08 NOTE — Therapy (Signed)
[place], [place], [address] Phone: [phone]   Fax:  [phone]  Physical Therapy Treatment  Patient Details  Name: Cheryl Hendricks MRN: [id number] Date of Birth: 27-Jan-1950 Referring Provider (PT): Jean Rosenthal MD   Encounter Date: 08/08/2019   PT End of Session - 08/08/19 1418    Visit Number 11    Number of Visits 13    Date for PT Re-Evaluation 08/13/19    Authorization Type MCR: Kx at mod at 15th visit    Progress Note Due on Visit 19    PT Start Time 1342   pt arrived 12 min late   PT Stop Time 1415    PT Time Calculation (min) 33 min    Activity Tolerance Patient tolerated treatment well    Behavior During Therapy [facility] for tasks assessed/performed           Past Medical History:  Diagnosis Date  . Diabetes mellitus without complication [facility])     Past Surgical History:  Procedure Laterality Date  . OVARIAN CYST REMOVAL  1973  . TUBAL LIGATION  1996    There were no vitals filed for this visit.   Subjective Assessment - 08/08/19 1344    Subjective "the MD hasn't gotten back with me regarding the results. I did go to my PCP which I got my shots and I am bit more fatigued today. I am having some soreness into the hip which I think was due to not keeping up with my exercise"    Currently in Pain? Yes    Pain Score 1    took aspirin this morning.   Pain Location Hip    Pain Orientation Right    Pain Descriptors / Indicators Aching    Pain Type Chronic pain    Pain Onset More than a month ago    Pain Frequency Intermittent    Aggravating Factors  prolonged standing/ walking    Pain Relieving Factors medication.              [facility] PT Assessment - 08/08/19 0001      Assessment   Medical Diagnosis  Low back pain, unspecified back pain laterality, unspecified chronicity, unspecified whether sciatica present , R hip pain    Referring Provider (PT) Jean Rosenthal MD                          [facility] Adult PT Treatment/Exercise - 08/08/19 0001      Lumbar Exercises: Stretches   Hip Flexor Stretch 2 reps;30 seconds    Other Lumbar Stretch Exercise hip adductors 2 x 30    standing lateral lunge     Knee/Hip Exercises: Aerobic   Recumbent Bike L3 x 5 min      Knee/Hip Exercises: Standing   Gait Training heel strike/ toe off with reciprocal arm swing using trekking poles. tacile cues to promote pelvic rotation with stepping    with heel lift in L shoe   Other Standing Knee Exercises Marching in place with 1 HHA for stability, 2 x 10  ( pt able to control compensation but noted increased difficulty with SLS on RLE)   pt reported adductor stiffness                   PT Short Term Goals - 07/26/19 1144      PT SHORT TERM GOAL #1   Title pt to be I with  inital HEp    Period Weeks    Status Achieved             PT Long Term Goals - 07/30/19 1336      PT LONG TERM GOAL #1   Title increase R hip IR/ER by >/= 8 degrees with  </= 2/10 pain for functional hip mobiltiy    Baseline met ROM goal, but still has pain that increased with activity    Period Weeks    Status On-going      PT LONG TERM GOAL #2   Title increase gross R hip strength to >/= 4+/5 to promote stability with walking/ standing    Period Weeks    Status On-going      PT LONG TERM GOAL #3   Title pt to verbalize/ demo efficient gait pattern with </= 2/10 pain    Period Weeks    Status On-going      PT LONG TERM GOAL #4   Title increase FOTO score for back to </=32% limited and hip to </= 29% limited to demo functional improvement    Period Weeks    Status On-going      PT LONG TERM GOAL #5   Title pt to be I with all HEP given to maintain and progress current level of function    Period Weeks    Status On-going                 Plan - 08/08/19 1557    Clinical Impression Statement pt arrived late today's session. provided heel lift in the L shoe  which she noted feeling better with standing and walking in the hip/ ankle. She did note having increased stiffness in the hip adductors with is potentially related due to heel lift promoted pelvic equality. plan to reassess next session.    PT Treatment/Interventions Contrast Bath;ADLs/Self Care Home Management;Cryotherapy;Electrical Stimulation;Iontophoresis 26m/ml Dexamethasone;Moist Heat;Ultrasound;Traction;Functional mobility training;Therapeutic activities;Therapeutic exercise;Balance training;Neuromuscular re-education;Patient/family education;Manual techniques;Passive range of motion;Dry needling;Taping    PT Next Visit Plan ERO, Assess response to update HEP, continue glute strengthening, core/hip strengthening, response Reformer, hows heel lift? release adductors, did she get results from MRI.    PT Home Exercise Plan [id number]-  supine marching, sidelying hip abduction, lower trunk rotatin, (supine/ standing) hip flexor stretch with strap, towel roll piriformis release, heel strike/ toe off pattern, hip flx to 90+abd stretch, supine IR stretch foot anchored, bridge with green band on heels, hooklying clamshell with green, quadruped donkey kick    Consulted and Agree with Plan of Care Patient           Patient will benefit from skilled therapeutic intervention in order to improve the following deficits and impairments:  Improper body mechanics, Increased muscle spasms, Decreased strength, Pain, Postural dysfunction, Decreased activity tolerance, Decreased endurance, Decreased range of motion, Abnormal gait  Visit Diagnosis: Pain in right hip  Chronic low back pain, unspecified back pain laterality, unspecified whether sciatica present  Muscle weakness (generalized)  Other abnormalities of gait and mobility     Problem List There are no problems to display for this patient.  KStarr LakePT, DPT, LAT, ATC  08/08/19  4:01 PM      C[facility] Hospital17487 Howard DriveGClio NAl[place] [address]Phone: [phone]  Fax:  [phone] Name: Cheryl BouMRN: [id number] of Birth: [birth year]

## 2019-08-10 ENCOUNTER — Ambulatory Visit: Payer: Medicare Other | Admitting: Physical Therapy

## 2019-08-10 ENCOUNTER — Other Ambulatory Visit: Payer: Self-pay

## 2019-08-10 ENCOUNTER — Encounter: Payer: Self-pay | Admitting: Physical Therapy

## 2019-08-10 DIAGNOSIS — M25551 Pain in right hip: Secondary | ICD-10-CM

## 2019-08-10 DIAGNOSIS — G8929 Other chronic pain: Secondary | ICD-10-CM | POA: Diagnosis not present

## 2019-08-10 DIAGNOSIS — R2689 Other abnormalities of gait and mobility: Secondary | ICD-10-CM

## 2019-08-10 DIAGNOSIS — M6281 Muscle weakness (generalized): Secondary | ICD-10-CM

## 2019-08-10 DIAGNOSIS — M545 Low back pain: Secondary | ICD-10-CM | POA: Diagnosis not present

## 2019-08-10 NOTE — Therapy (Signed)
[place], [place], [address] Phone: [phone]   Fax:  [phone]  Physical Therapy Treatment / Re-certification.   Patient Details  Name: Cheryl Hendricks MRN: [id number] Date of Birth: 12-24-1950 Referring Provider (PT): Jean Rosenthal MD   Encounter Date: 08/10/2019   PT End of Session - 08/10/19 1100    Visit Number 12    Number of Visits 18    Date for PT Re-Evaluation 09/07/19    Authorization Type MCR: Kx at mod at 15th visit    Progress Note Due on Visit 19    PT Start Time 1058   pt arrived 13 min late today   PT Stop Time 1131    PT Time Calculation (min) 33 min    Activity Tolerance Patient tolerated treatment well    Behavior During Therapy [facility] for tasks assessed/performed           Past Medical History:  Diagnosis Date   Diabetes mellitus without complication ([place])     Past Surgical History:  Procedure Laterality Date   [place]    There were no vitals filed for this visit.   Subjective Assessment - 08/10/19 1102    Subjective " I tried to get the MRI information after the last session but they weren't able to provide me with any information. the Heel lift is helping I feel like I am standing up more straight today."    Currently in Pain? Yes    Pain Location Hip    Pain Orientation Right    Pain Descriptors / Indicators Sore    Pain Type Chronic pain    Pain Onset More than a month ago    Pain Frequency Intermittent    Aggravating Factors  prolonged walking/ standing    Pain Relieving Factors medication              [facility] PT Assessment - 08/10/19 0001      Assessment   Medical Diagnosis  Low back pain, unspecified back pain laterality, unspecified chronicity, unspecified whether sciatica present , R hip pain    Referring Provider (PT) Jean Rosenthal MD      Observation/Other Assessments   Focus on Therapeutic  Outcomes (FOTO)  Back 33% and hip 33% limited      AROM   Right Hip External Rotation  35    Right Hip Internal Rotation  20   soreness in the hip      Strength   Right Hip Flexion 4/5    Right Hip Extension 3+/5   pain during testing   Right Hip External Rotation  4/5   Apprehension during testing due to pain   Right Hip Internal Rotation 4/5    Right Hip ABduction 3+/5   pain during testing   Right Hip ADduction 4/5    Right Knee Flexion 4+/5    Right Knee Extension 4+/5                         [facility] Adult PT Treatment/Exercise - 08/10/19 0001      Lumbar Exercises: Stretches   Hip Flexor Stretch 2 reps;30 seconds   in supine     Lumbar Exercises: Aerobic   Nustep L7 x 5 min LE only      Knee/Hip Exercises: Standing   Functional Squat 1 set;10 reps   with bil HHA from free motion  Other Standing Knee Exercises lateral band walks 2 x 10    Other Standing Knee Exercises Marching in place with 1 HHA for stability, 2 x 10  ( pt able to control compensation but noted increased difficulty with SLS on RLE)   pt reported adductor stiffness                 PT Education - 08/10/19 1123    Education Details Reviewed HEP and plan for treatment moving forward. reviewed FOTO reassessment    Person(s) Educated Patient    Methods Explanation;Verbal cues    Comprehension Verbalized understanding;Verbal cues required            PT Short Term Goals - 07/26/19 1144      PT SHORT TERM GOAL #1   Title pt to be I with inital HEp    Period Weeks    Status Achieved             PT Long Term Goals - 08/10/19 1108      PT LONG TERM GOAL #1   Title increase R hip IR/ER by >/= 8 degrees with  </= 2/10 pain for functional hip mobiltiy    Status On-going      PT LONG TERM GOAL #2   Title increase gross R hip strength to >/= 4+/5 to promote stability with walking/ standing    Period Weeks    Status On-going      PT LONG TERM GOAL #3   Title pt to verbalize/  demo efficient gait pattern with </= 2/10 pain    Baseline reports 1/10 pain with but continues antalgic gait pattern    Period Weeks    Status Partially Met      PT LONG TERM GOAL #4   Title increase FOTO score for back to </=32% limited and hip to </= 29% limited to demo functional improvement    Period Weeks    Status On-going      PT LONG TERM GOAL #5   Title pt to be I with all HEP given to maintain and progress current level of function    Period Weeks    Status On-going                 Plan - 08/10/19 1145    Clinical Impression Statement pt arrived 13 minutes late today. She is making progress with physical therapy reporting decreased R hip pain. She continues to demonstrate and antalgic gait pattern during stance phase on RLE. continued weakness noted in hip abductors/ extensors and external rotations secondary to soreness during testing. Continued focus of hip abductor/ extensor strengthening today. Plan to conitnue with current Treatment plan focusing on gross hipo strengtheing and functional mobility and work toward independent exercise.    PT Frequency 1x / week    PT Duration 4 weeks    PT Treatment/Interventions Contrast Bath;ADLs/Self Care Home Management;Cryotherapy;Electrical Stimulation;Iontophoresis 68m/ml Dexamethasone;Moist Heat;Ultrasound;Traction;Functional mobility training;Therapeutic activities;Therapeutic exercise;Balance training;Neuromuscular re-education;Patient/family education;Manual techniques;Passive range of motion;Dry needling;Taping    PT Next Visit Plan Assess response to update HEP, continue glute strengthening, core/hip strengthening, pilates, hows heel lift? release adductors, did she get results from MRI.    PT Home Exercise Plan [id number]-  supine marching, sidelying hip abduction, lower trunk rotatin, (supine/ standing) hip flexor stretch with strap, towel roll piriformis release, heel strike/ toe off pattern, hip flx to 90+abd stretch,  supine IR stretch foot anchored, bridge with green band on heels, hooklying clamshell with green, quadruped  donkey kick    Consulted and Agree with Plan of Care Patient           Patient will benefit from skilled therapeutic intervention in order to improve the following deficits and impairments:  Improper body mechanics, Increased muscle spasms, Decreased strength, Pain, Postural dysfunction, Decreased activity tolerance, Decreased endurance, Decreased range of motion, Abnormal gait  Visit Diagnosis: Pain in right hip  Chronic low back pain, unspecified back pain laterality, unspecified whether sciatica present  Muscle weakness (generalized)  Other abnormalities of gait and mobility     Problem List There are no problems to display for this patient.  [name] PT, DPT, LAT, ATC  08/10/19  11:52 AM      [facility] [address] [place], [place], [address] Phone: [phone]   Fax:  [phone]  Name: Cheryl Hendricks MRN: [id number] Date of Birth: 08/28/1950

## 2019-08-13 ENCOUNTER — Ambulatory Visit: Payer: Medicare Other | Attending: Orthopaedic Surgery | Admitting: Physical Therapy

## 2019-08-13 ENCOUNTER — Other Ambulatory Visit: Payer: Self-pay

## 2019-08-13 DIAGNOSIS — M545 Low back pain, unspecified: Secondary | ICD-10-CM

## 2019-08-13 DIAGNOSIS — G8929 Other chronic pain: Secondary | ICD-10-CM | POA: Insufficient documentation

## 2019-08-13 DIAGNOSIS — R2689 Other abnormalities of gait and mobility: Secondary | ICD-10-CM | POA: Insufficient documentation

## 2019-08-13 DIAGNOSIS — M6281 Muscle weakness (generalized): Secondary | ICD-10-CM | POA: Diagnosis not present

## 2019-08-13 DIAGNOSIS — M25551 Pain in right hip: Secondary | ICD-10-CM | POA: Insufficient documentation

## 2019-08-13 NOTE — Therapy (Signed)
[place], [place], [address] Phone: [phone]   Fax:  [phone]  Physical Therapy Treatment  Patient Details  Name: Cheryl Hendricks MRN: [id number] Date of Birth: 1950/09/17 Referring Provider (PT): Jean Rosenthal MD   Encounter Date: 08/13/2019   PT End of Session - 08/13/19 1345    Visit Number 13    Number of Visits 18    Date for PT Re-Evaluation 09/07/19    Authorization Type MCR: Kx at mod at 15th visit    Progress Note Due on Visit 19    PT Start Time 1344   pt arrived 14 min late today   PT Stop Time 1413    PT Time Calculation (min) 29 min    Activity Tolerance Patient tolerated treatment well    Behavior During Therapy [facility] for tasks assessed/performed           Past Medical History:  Diagnosis Date  . Diabetes mellitus without complication [facility])     Past Surgical History:  Procedure Laterality Date  . OVARIAN CYST REMOVAL  1973  . TUBAL LIGATION  1996    There were no vitals filed for this visit.   Subjective Assessment - 08/13/19 1346    Subjective " I am doing okay, maybe pain is 1/10."    Currently in Pain? Yes    Pain Score 1     Pain Orientation Right    Pain Descriptors / Indicators Aching;Sore    Pain Type Chronic pain    Pain Onset More than a month ago              [name] PT Assessment - 08/13/19 0001      Assessment   Medical Diagnosis  Low back pain, unspecified back pain laterality, unspecified chronicity, unspecified whether sciatica present , R hip pain    Referring Provider (PT) Jean Rosenthal MD                         [facility] Adult PT Treatment/Exercise - 08/13/19 0001      Lumbar Exercises: Aerobic   Nustep L7 x 5 min      Lumbar Exercises: Machines for Strengthening   Leg Press 2 x 15 40# bil LE, 1 x 15 con bil/ ecc RLE      Knee/Hip Exercises: Standing   Hip Abduction Stengthening;2 sets;15 reps;Knee straight   with  yellow theraband   Forward Step Up 2 sets;15 reps;Step Height: 6"    Gait Training heel strike/ toe off with reciprocal arm swing using trekking poles. tacile cues to promote pelvic rotation with stepping    cues to take shorter steps which reduced lateral trunk lean                   PT Short Term Goals - 07/26/19 1144      PT SHORT TERM GOAL #1   Title pt to be I with inital HEp    Period Weeks    Status Achieved             PT Long Term Goals - 08/10/19 1108      PT LONG TERM GOAL #1   Title increase R hip IR/ER by >/= 8 degrees with  </= 2/10 pain for functional hip mobiltiy    Status On-going      PT LONG TERM GOAL #2   Title increase gross R hip strength to >/= 4+/5 to  promote stability with walking/ standing    Period Weeks    Status On-going      PT LONG TERM GOAL #3   Title pt to verbalize/ demo efficient gait pattern with </= 2/10 pain    Baseline reports 1/10 pain with but continues antalgic gait pattern    Period Weeks    Status Partially Met      PT LONG TERM GOAL #4   Title increase FOTO score for back to </=32% limited and hip to </= 29% limited to demo functional improvement    Period Weeks    Status On-going      PT LONG TERM GOAL #5   Title pt to be I with all HEP given to maintain and progress current level of function    Period Weeks    Status On-going                 Plan - 08/13/19 1359    Clinical Impression Statement pt arrived 14 min late today. continued working on hip strengthening with emphasis on extensors/ abductors wit hincreased reps/ sets to promote strength and endurnace. She performed exericses well but did fatigue quickly.    PT Treatment/Interventions Contrast Bath;ADLs/Self Care Home Management;Cryotherapy;Electrical Stimulation;Iontophoresis 60m/ml Dexamethasone;Moist Heat;Ultrasound;Traction;Functional mobility training;Therapeutic activities;Therapeutic exercise;Balance training;Neuromuscular  re-education;Patient/family education;Manual techniques;Passive range of motion;Dry needling;Taping    PT Next Visit Plan Assess response to update HEP, continue glute strengthening, core/hip strengthening, pilates, hows heel lift? release adductors, did she get results from MRI.    PT Home Exercise Plan [id number]-  supine marching, sidelying hip abduction, lower trunk rotatin, (supine/ standing) hip flexor stretch with strap, towel roll piriformis release, heel strike/ toe off pattern, hip flx to 90+abd stretch, supine IR stretch foot anchored, bridge with green band on heels, hooklying clamshell with green, quadruped donkey kick           Patient will benefit from skilled therapeutic intervention in order to improve the following deficits and impairments:     Visit Diagnosis: Pain in right hip  Chronic low back pain, unspecified back pain laterality, unspecified whether sciatica present  Muscle weakness (generalized)  Other abnormalities of gait and mobility     Problem List There are no problems to display for this patient.   KStarr LakePT, DPT, LAT, ATC  08/13/19  2:15 PM      [facility] [address]Phone: [phone]  Fax:  [phone] Name: Cheryl BurichMRN: [id number] of Birth: [birth year]

## 2019-08-15 ENCOUNTER — Ambulatory Visit (INDEPENDENT_AMBULATORY_CARE_PROVIDER_SITE_OTHER): Payer: Medicare Other | Admitting: Orthopaedic Surgery

## 2019-08-15 ENCOUNTER — Encounter: Payer: Self-pay | Admitting: Orthopaedic Surgery

## 2019-08-15 DIAGNOSIS — M1611 Unilateral primary osteoarthritis, right hip: Secondary | ICD-10-CM | POA: Insufficient documentation

## 2019-08-15 NOTE — Progress Notes (Signed)
The patient comes in today for follow-up after having a MRI of her right hip.  She had a trochanteric injection was not helpful.  Most of her pain is been in the groin with rotation of the hip and weightbearing.  Her plain films did not show any significant changes so we sent her for an MRI of the hip.  She is here for review this today.  On examination of her hip again when I put her through internal and external rotation of the right hip she does have pain on extremes of rotation.  It is in the groin.  The MRI does show degenerative labral tearing at the superior aspect of the labrum with edema in the femoral head and acetabulum showing osteoarthritis of the hip.  Of note they do see an adnexal mass.  The radiologist has suggested further work-up with an ultrasound.  She does have a regular GYN physician here in town.  She saw that physician in June.  I recommended her get another follow-up with that physician.  We will order the pelvic ultrasound.  I am not sure if it needs to be transvaginal or not.  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  This certainly could be related.  From a hip standpoint, we will have her set up for an intervention with Dr. Ernestina Patches with a fluoroscopic guided intra-articular steroid injection in the right hip.  I will then see her back in follow-up to see how she is done with that.  All question concerns were answered and addressed.

## 2019-08-16 ENCOUNTER — Other Ambulatory Visit: Payer: Self-pay

## 2019-08-16 DIAGNOSIS — R935 Abnormal findings on diagnostic imaging of other abdominal regions, including retroperitoneum: Secondary | ICD-10-CM

## 2019-08-16 DIAGNOSIS — M1611 Unilateral primary osteoarthritis, right hip: Secondary | ICD-10-CM

## 2019-08-22 ENCOUNTER — Other Ambulatory Visit: Payer: Self-pay

## 2019-08-22 ENCOUNTER — Ambulatory Visit: Payer: Medicare Other | Admitting: Physical Therapy

## 2019-08-22 ENCOUNTER — Encounter: Payer: Self-pay | Admitting: Physical Therapy

## 2019-08-22 DIAGNOSIS — R2689 Other abnormalities of gait and mobility: Secondary | ICD-10-CM

## 2019-08-22 DIAGNOSIS — G8929 Other chronic pain: Secondary | ICD-10-CM | POA: Diagnosis not present

## 2019-08-22 DIAGNOSIS — M25551 Pain in right hip: Secondary | ICD-10-CM

## 2019-08-22 DIAGNOSIS — M545 Low back pain, unspecified: Secondary | ICD-10-CM

## 2019-08-22 DIAGNOSIS — M6281 Muscle weakness (generalized): Secondary | ICD-10-CM

## 2019-08-22 NOTE — Therapy (Signed)
[place], [place], [address] Phone: [phone]   Fax:  [phone]  Physical Therapy Treatment  Patient Details  Name: Cheryl Hendricks MRN: [id number] Date of Birth: 05-27-1950 Referring Provider (PT): Jean Rosenthal MD   Encounter Date: 08/22/2019   PT End of Session - 08/22/19 1151    Visit Number 14    Number of Visits 18    Date for PT Re-Evaluation 09/07/19    Authorization Type MCR: Kx at mod at 15th visit    Progress Note Due on Visit 19    PT Start Time 1150    PT Stop Time 1228    PT Time Calculation (min) 38 min    Activity Tolerance Patient tolerated treatment well    Behavior During Therapy [facility] for tasks assessed/performed           Past Medical History:  Diagnosis Date  . Diabetes mellitus without complication [facility])     Past Surgical History:  Procedure Laterality Date  . OVARIAN CYST REMOVAL  1973  . TUBAL LIGATION  1996    There were no vitals filed for this visit.   Subjective Assessment - 08/22/19 1151    Subjective "I saw the Md and they went over the imaging. They recommended an injection and may be on my way to a hip replacement. I am unsure of the injection vs replacement, but wanted to see how the injection goes first."    Currently in Pain? Yes    Pain Score 1     Pain Orientation Right    Pain Descriptors / Indicators Aching;Sore    Pain Type Chronic pain    Pain Onset More than a month ago    Pain Frequency Intermittent    Aggravating Factors  standing and walking for long periods of time    Pain Relieving Factors medication              [facility] PT Assessment - 08/22/19 0001      Assessment   Medical Diagnosis  Low back pain, unspecified back pain laterality, unspecified chronicity, unspecified whether sciatica present , R hip pain    Referring Provider (PT) Jean Rosenthal MD                         [facility] Adult PT  Treatment/Exercise - 08/22/19 0001      Lumbar Exercises: Stretches   Hip Flexor Stretch Right;1 rep;30 seconds      Lumbar Exercises: Aerobic   Nustep L6 x 5 min LE only      Lumbar Exercises: Machines for Strengthening   Leg Press 2 x 15 con bil/ ecc RLE 40#      Knee/Hip Exercises: Standing   SLS with Vectors bil 4-way with red theraband 1 x 15 ea.      Knee/Hip Exercises: Supine   Bridges 2 sets;10 reps   wiht hip flexion with heels on red physioball                 PT Education - 08/22/19 1237    Education Details Reviewed HEP and plan for remainder of visits.    Person(s) Educated Patient    Methods Explanation;Verbal cues    Comprehension Verbalized understanding;Verbal cues required            PT Short Term Goals - 07/26/19 1144      PT SHORT TERM GOAL #1   Title pt to be  I with inital HEp    Period Weeks    Status Achieved             PT Long Term Goals - 08/10/19 1108      PT LONG TERM GOAL #1   Title increase R hip IR/ER by >/= 8 degrees with  </= 2/10 pain for functional hip mobiltiy    Status On-going      PT LONG TERM GOAL #2   Title increase gross R hip strength to >/= 4+/5 to promote stability with walking/ standing    Period Weeks    Status On-going      PT LONG TERM GOAL #3   Title pt to verbalize/ demo efficient gait pattern with </= 2/10 pain    Baseline reports 1/10 pain with but continues antalgic gait pattern    Period Weeks    Status Partially Met      PT LONG TERM GOAL #4   Title increase FOTO score for back to </=32% limited and hip to </= 29% limited to demo functional improvement    Period Weeks    Status On-going      PT LONG TERM GOAL #5   Title pt to be I with all HEP given to maintain and progress current level of function    Period Weeks    Status On-going                 Plan - 08/22/19 1238    Clinical Impression Statement pt saw the MD and reports they plan for a pelvic US and intraarticular  injection for the R hip. Pt notes feeling down following the visit and hasn't done muhc in regard to her HEP. continued focus on gross hip strengthening/ stability which she did well but fatigued quickly. no increase in pain during or following session.    PT Treatment/Interventions Contrast Bath;ADLs/Self Care Home Management;Cryotherapy;Electrical Stimulation;Iontophoresis 18m/ml Dexamethasone;Moist Heat;Ultrasound;Traction;Functional mobility training;Therapeutic activities;Therapeutic exercise;Balance training;Neuromuscular re-education;Patient/family education;Manual techniques;Passive range of motion;Dry needling;Taping    PT Next Visit Plan Assess response to update HEP, continue glute strengthening, core/hip strengthening, pilates, hows heel lift? release adductors, did she get results from MRI.    PT Home Exercise Plan [id number]-  supine marching, sidelying hip abduction, lower trunk rotatin, (supine/ standing) hip flexor stretch with strap, towel roll piriformis release, heel strike/ toe off pattern, hip flx to 90+abd stretch, supine IR stretch foot anchored, bridge with green band on heels, hooklying clamshell with green, quadruped donkey kick    Consulted and Agree with Plan of Care Patient           Patient will benefit from skilled therapeutic intervention in order to improve the following deficits and impairments:  Improper body mechanics, Increased muscle spasms, Decreased strength, Pain, Postural dysfunction, Decreased activity tolerance, Decreased endurance, Decreased range of motion, Abnormal gait  Visit Diagnosis: Pain in right hip  Chronic low back pain, unspecified back pain laterality, unspecified whether sciatica present  Muscle weakness (generalized)  Other abnormalities of gait and mobility     Problem List Patient Active Problem List   Diagnosis Date Noted  . Unilateral primary osteoarthritis, right hip 08/15/2019   KStarr LakePT, DPT, LAT, ATC   08/22/19  12:41 PM      [facility] [address]Phone: [phone]  Fax:  [phone] Name: Cheryl RashadMRN: [id number] of Birth: 227-Sep-1952

## 2019-08-23 ENCOUNTER — Ambulatory Visit (INDEPENDENT_AMBULATORY_CARE_PROVIDER_SITE_OTHER): Payer: Medicare Other | Admitting: Physical Medicine and Rehabilitation

## 2019-08-23 ENCOUNTER — Ambulatory Visit
Admission: RE | Admit: 2019-08-23 | Discharge: 2019-08-23 | Disposition: A | Discharge status: Home / Self Care | Payer: Medicare Other | Source: Ambulatory Visit | Attending: Orthopaedic Surgery | Admitting: Orthopaedic Surgery

## 2019-08-23 ENCOUNTER — Encounter: Payer: Self-pay | Admitting: Physical Medicine and Rehabilitation

## 2019-08-23 ENCOUNTER — Ambulatory Visit: Payer: Self-pay

## 2019-08-23 DIAGNOSIS — R935 Abnormal findings on diagnostic imaging of other abdominal regions, including retroperitoneum: Secondary | ICD-10-CM

## 2019-08-23 DIAGNOSIS — M25551 Pain in right hip: Secondary | ICD-10-CM | POA: Diagnosis not present

## 2019-08-23 DIAGNOSIS — N888 Other specified noninflammatory disorders of cervix uteri: Secondary | ICD-10-CM | POA: Diagnosis not present

## 2019-08-23 DIAGNOSIS — N83201 Unspecified ovarian cyst, right side: Secondary | ICD-10-CM | POA: Diagnosis not present

## 2019-08-23 DIAGNOSIS — Z780 Asymptomatic menopausal state: Secondary | ICD-10-CM | POA: Diagnosis not present

## 2019-08-23 DIAGNOSIS — N838 Other noninflammatory disorders of ovary, fallopian tube and broad ligament: Secondary | ICD-10-CM | POA: Diagnosis not present

## 2019-08-23 DIAGNOSIS — M1611 Unilateral primary osteoarthritis, right hip: Secondary | ICD-10-CM

## 2019-08-23 NOTE — Progress Notes (Signed)
Right hip pain. Worse with walking; not much pain with sitting. Numeric Pain Rating Scale and Functional Assessment Average Pain 8   In the last MONTH (on 0-10 scale) has pain interfered with the following?  1. General activity like being  able to carry out your everyday physical activities such as walking, climbing stairs, carrying groceries, or moving a chair?  Rating(7)   +Driver, -BT, -Dye Allergies.

## 2019-08-23 NOTE — Progress Notes (Signed)
   Cheryl Hendricks - 70 y.o. female MRN [id number]  Date of birth: January 19, 1950  Office Visit Note: Visit Date: 08/23/2019 PCP: Wenda Low, MD Referred by: Wenda Low, MD  Subjective: Chief Complaint  Patient presents with  . Right Hip - Pain   HPI:  Cheryl Hendricks is a 69 y.o. female who comes in today at the request of Dr. Jean Rosenthal for planned Right anesthetic hip arthrogram with fluoroscopic guidance.  The patient has failed conservative care including home exercise, medications, time and activity modification.  This injection will be diagnostic and hopefully therapeutic.  Please see requesting physician notes for further details and justification.  ROS Otherwise per HPI.  Assessment & Plan: Visit Diagnoses:  1. Pain in right hip     Plan: No additional findings.   Meds & Orders: No orders of the defined types were placed in this encounter.   Orders Placed This Encounter  Procedures  . Large Joint Inj: R hip joint  . XR C-ARM NO REPORT    Follow-up: Return for Jean Rosenthal, M.D. 09/26/19 as scheduled.   Procedures: Large Joint Inj: R hip joint on 08/23/2019 3:03 PM Indications: pain and diagnostic evaluation Details: 22 G needle, anterior approach  Arthrogram: Yes  Medications: 4 mL bupivacaine 0.25 %; 60 mg triamcinolone acetonide 40 MG/ML Outcome: tolerated well, no immediate complications  Arthrogram demonstrated excellent flow of contrast throughout the joint surface without extravasation or obvious defect.  The patient had relief of symptoms during the anesthetic phase of the injection.  Procedure, treatment alternatives, risks and benefits explained, specific risks discussed. Consent was given by the patient. Immediately prior to procedure a time out was called to verify the correct patient, procedure, equipment, support staff and site/side marked as required. Patient was prepped and draped in the usual sterile fashion.      No notes on file    Clinical History: No specialty comments available.     Objective:  VS:  HT:    WT:   BMI:     BP:   HR: bpm  TEMP: ( )  RESP:  Physical Exam   Imaging: No results found.

## 2019-08-24 ENCOUNTER — Telehealth: Payer: Self-pay | Admitting: Orthopedic Surgery

## 2019-08-24 NOTE — Telephone Encounter (Signed)
Call report for vaginal ultrasound.  Results in Epic, please review per [facility].

## 2019-08-29 ENCOUNTER — Ambulatory Visit: Payer: Medicare Other | Admitting: Physical Therapy

## 2019-09-03 DIAGNOSIS — Z20822 Contact with and (suspected) exposure to covid-19: Secondary | ICD-10-CM | POA: Diagnosis not present

## 2019-09-05 ENCOUNTER — Ambulatory Visit: Payer: Medicare Other | Admitting: Physical Therapy

## 2019-09-05 ENCOUNTER — Encounter: Payer: Medicare Other | Admitting: Physical Therapy

## 2019-09-05 ENCOUNTER — Encounter: Payer: Self-pay | Admitting: Physical Therapy

## 2019-09-05 ENCOUNTER — Other Ambulatory Visit: Payer: Self-pay

## 2019-09-05 DIAGNOSIS — M545 Low back pain, unspecified: Secondary | ICD-10-CM

## 2019-09-05 DIAGNOSIS — N9489 Other specified conditions associated with female genital organs and menstrual cycle: Secondary | ICD-10-CM | POA: Diagnosis not present

## 2019-09-05 DIAGNOSIS — R2689 Other abnormalities of gait and mobility: Secondary | ICD-10-CM | POA: Diagnosis not present

## 2019-09-05 DIAGNOSIS — M6281 Muscle weakness (generalized): Secondary | ICD-10-CM

## 2019-09-05 DIAGNOSIS — G8929 Other chronic pain: Secondary | ICD-10-CM | POA: Diagnosis not present

## 2019-09-05 DIAGNOSIS — M25551 Pain in right hip: Secondary | ICD-10-CM

## 2019-09-05 NOTE — Therapy (Signed)
[place], [place], [address] Phone: [phone]   Fax:  [phone]  Physical Therapy Treatment / Re-certification  Patient Details  Name: Cheryl Hendricks MRN: [id number] Date of Birth: 05/27/1950 Referring Provider (PT): Jean Rosenthal MD   Encounter Date: 09/05/2019   PT End of Session - 09/05/19 1154    Visit Number 15    Number of Visits 19    Date for PT Re-Evaluation 10/03/19    Authorization Type MCR: Kx at mod at 15th visit    Progress Note Due on Visit 19    PT Start Time 1151   pt arrived 10 min late   PT Stop Time 1230    PT Time Calculation (min) 39 min    Activity Tolerance Patient tolerated treatment well    Behavior During Therapy [facility] for tasks assessed/performed           Past Medical History:  Diagnosis Date   Diabetes mellitus without complication ([place])     Past Surgical History:  Procedure Laterality Date   [place]    There were no vitals filed for this visit.   Subjective Assessment - 09/05/19 1154    Subjective " The injection went well I am feeling much better. I can tell I am still getting some times it wants to give out if I turn in the right position. I stil use the insert and it helps."    Patient Stated Goals to decrease the pain,    Currently in Pain? Yes    Pain Score 0-No pain    Pain Orientation Right    Pain Descriptors / Indicators Aching    Pain Radiating Towards walking/ turning in a certain position.    Pain Onset More than a month ago    Pain Frequency Intermittent    Aggravating Factors  standing, and walking    Pain Relieving Factors medication,              [facility] PT Assessment - 09/05/19 0001      Assessment   Medical Diagnosis  Low back pain, unspecified back pain laterality, unspecified chronicity, unspecified whether sciatica present , R hip pain    Referring Provider (PT) Jean Rosenthal MD      AROM   Right Hip External Rotation  32    Right Hip Internal Rotation  30      Strength   Right Hip Flexion 4+/5    Right Hip Extension 4-/5    Right Hip External Rotation  4+/5    Right Hip Internal Rotation 4+/5    Right Hip ABduction 3+/5                         [facility] Adult PT Treatment/Exercise - 09/05/19 0001      Lumbar Exercises: Aerobic   Nustep L7 x 5 min LE only      Knee/Hip Exercises: Sidelying   Other Sidelying Knee/Hip Exercises hip abduction 2 x going to fatigue, progressing to AAROM to promote eccentric loading x 10, progressing to clamshell to fatigue x 2    Other Sidelying Knee/Hip Exercises maintaining hip                   PT Education - 09/05/19 1223    Education Details Reviewed HEP and discussed importance of strengthening at home working on hip abductors /extensors. reviewed Plan  for the next 4 visits.    Person(s) Educated Patient    Methods Verbal cues;Explanation    Comprehension Verbalized understanding;Verbal cues required            PT Short Term Goals - 07/26/19 1144      PT SHORT TERM GOAL #1   Title pt to be I with inital HEp    Period Weeks    Status Achieved             PT Long Term Goals - 09/05/19 1157      PT LONG TERM GOAL #1   Title increase R hip IR/ER by >/= 8 degrees with  </= 2/10 pain for functional hip mobiltiy    Baseline met ROM goal, mild ache at 1/10    Period Weeks    Status Achieved      PT LONG TERM GOAL #2   Title increase gross R hip strength to >/= 4+/5 to promote stability with walking/ standing    Baseline continued 4-/5 hip extensor weakness, and 3+/5 R hip abductor    Period Weeks    Status Partially Met      PT LONG TERM GOAL #3   Title pt to verbalize/ demo efficient gait pattern with </= 2/10 pain    Baseline continues to demo an antalgic pattern    Period Weeks    Status Partially Met      PT LONG TERM GOAL #4   Title increase FOTO score for back  to </=32% limited and hip to </= 29% limited to demo functional improvement    Period Weeks    Status Unable to assess      PT LONG TERM GOAL #5   Title pt to be I with all HEP given to maintain and progress current level of function    Baseline progressing as able.    Period Weeks    Status Partially Met                 Plan - 09/05/19 1233    Clinical Impression Statement pt reprorts the injection in the hip has made a big difference with pain but continues to have singificant weakness in the hip abductors and extensors. She is making good progress toward her goals. focused todays session on strengthening working to fatigue. plan to see pt 1 x a week for the next 4 weeks to focus on functional deficits and work toward independent HEP.    PT Frequency 1x / week    PT Duration 4 weeks    PT Treatment/Interventions Contrast Bath;ADLs/Self Care Home Management;Cryotherapy;Electrical Stimulation;Iontophoresis 41m/ml Dexamethasone;Moist Heat;Ultrasound;Traction;Functional mobility training;Therapeutic activities;Therapeutic exercise;Balance training;Neuromuscular re-education;Patient/family education;Manual techniques;Passive range of motion;Dry needling;Taping    PT Next Visit Plan Assess response to update HEP, continue glute strengthening, core/hip strengthening, pilates, gross hip abductor/ extensor strengthening    PT Home Exercise Plan [id number] -  supine marching, sidelying hip abduction, lower trunk rotatin, (supine/ standing) hip flexor stretch with strap, towel roll piriformis release, heel strike/ toe off pattern, hip flx to 90+abd stretch, supine IR stretch foot anchored, bridge with green band on heels, hooklying clamshell with green, quadruped donkey kick    Consulted and Agree with Plan of Care Patient           Patient will benefit from skilled therapeutic intervention in order to improve the following deficits and impairments:  Improper body mechanics, Increased muscle  spasms, Decreased strength, Pain, Postural dysfunction, Decreased activity tolerance, Decreased endurance, Decreased range  of motion, Abnormal gait  Visit Diagnosis: Pain in right hip  Chronic low back pain, unspecified back pain laterality, unspecified whether sciatica present  Muscle weakness (generalized)  Other abnormalities of gait and mobility     Problem List Patient Active Problem List   Diagnosis Date Noted   Unilateral primary osteoarthritis, right hip 08/15/2019   [name] PT, DPT, LAT, ATC  09/05/19  12:36 PM      [facility] [address] [place], [place], [address] Phone: [phone]   Fax:  [phone]  Name: Cheryl Hendricks MRN: [id number] Date of Birth: 1950-09-01

## 2019-09-11 ENCOUNTER — Encounter: Payer: Self-pay | Admitting: Physical Therapy

## 2019-09-11 ENCOUNTER — Ambulatory Visit: Payer: Medicare Other | Admitting: Physical Therapy

## 2019-09-11 ENCOUNTER — Other Ambulatory Visit: Payer: Self-pay

## 2019-09-11 DIAGNOSIS — M25551 Pain in right hip: Secondary | ICD-10-CM | POA: Diagnosis not present

## 2019-09-11 DIAGNOSIS — R2689 Other abnormalities of gait and mobility: Secondary | ICD-10-CM | POA: Diagnosis not present

## 2019-09-11 DIAGNOSIS — M6281 Muscle weakness (generalized): Secondary | ICD-10-CM | POA: Diagnosis not present

## 2019-09-11 DIAGNOSIS — M545 Low back pain, unspecified: Secondary | ICD-10-CM

## 2019-09-11 DIAGNOSIS — G8929 Other chronic pain: Secondary | ICD-10-CM | POA: Diagnosis not present

## 2019-09-11 NOTE — Therapy (Signed)
[place], [place], [address] Phone: [phone]   Fax:  [phone]  Physical Therapy Treatment  Patient Details  Name: Cheryl Hendricks MRN: [id number] Date of Birth: 08-26-1950 Referring Provider (PT): Jean Rosenthal MD   Encounter Date: 09/11/2019   PT End of Session - 09/11/19 1342    Visit Number 16    Number of Visits 19    Date for PT Re-Evaluation 10/03/19    Authorization Type MCR: Kx at mod at 15th visit    Progress Note Due on Visit 19    PT Start Time 1340   pt arrived late   PT Stop Time 1413    PT Time Calculation (min) 33 min    Activity Tolerance Patient tolerated treatment well    Behavior During Therapy [facility] for tasks assessed/performed           Past Medical History:  Diagnosis Date  . Diabetes mellitus without complication [facility])     Past Surgical History:  Procedure Laterality Date  . OVARIAN CYST REMOVAL  1973  . TUBAL LIGATION  1996    There were no vitals filed for this visit.   Subjective Assessment - 09/11/19 1344    Subjective Not painful, my muscles are weak and they give out on me. I have not been doing my exercises like I should.    Patient Stated Goals to decrease the pain,    Currently in Pain? No/denies              [facility] PT Assessment - 09/11/19 0001      Strength   Overall Strength Comments 5STS 16s                         [facility] Adult PT Treatment/Exercise - 09/11/19 0001      Knee/Hip Exercises: Stretches   Passive Hamstring Stretch Limitations seated EOB      Knee/Hip Exercises: Aerobic   Nustep 5 min L8 LE only      Knee/Hip Exercises: Machines for Strengthening   Cybex Knee Extension 25lb x10 2 legs, 10lb x15 Rt leg    Cybex Knee Flexion 35lbx10 2 legs, Rt leg x15 15lb      Knee/Hip Exercises: Sidelying   Clams x20 2 sets    Other Sidelying Knee/Hip Exercises hip burner series: arcs, big circles, small circles 5 reps  each      Knee/Hip Exercises: Prone   Hip Extension Right;15 reps    Hip Extension Limitations quad set to hip ext                  PT Education - 09/11/19 1409    Education Details HEP recap, options for strengthening    Person(s) Educated Patient    Methods Explanation    Comprehension Verbalized understanding;Need further instruction            PT Short Term Goals - 07/26/19 1144      PT SHORT TERM GOAL #1   Title pt to be I with inital HEp    Period Weeks    Status Achieved             PT Long Term Goals - 09/05/19 1157      PT LONG TERM GOAL #1   Title increase R hip IR/ER by >/= 8 degrees with  </= 2/10 pain for functional hip mobiltiy    Baseline met ROM goal, mild ache at  1/10    Period Weeks    Status Achieved      PT LONG TERM GOAL #2   Title increase gross R hip strength to >/= 4+/5 to promote stability with walking/ standing    Baseline continued 4-/5 hip extensor weakness, and 3+/5 R hip abductor    Period Weeks    Status Partially Met      PT LONG TERM GOAL #3   Title pt to verbalize/ demo efficient gait pattern with </= 2/10 pain    Baseline continues to demo an antalgic pattern    Period Weeks    Status Partially Met      PT LONG TERM GOAL #4   Title increase FOTO score for back to </=32% limited and hip to </= 29% limited to demo functional improvement    Period Weeks    Status Unable to assess      PT LONG TERM GOAL #5   Title pt to be I with all HEP given to maintain and progress current level of function    Baseline progressing as able.    Period Weeks    Status Partially Met                 Plan - 09/11/19 1413    Clinical Impression Statement Challenged hip abductor endurance with hip burner series today. 16s for 5TSTS and asked her to work on this at home. she feels good functionally but I asked her to pay attention to make sure she doesn't come across anyting. She did orientation to work at [employer] center and may  have to do some stairs.    PT Treatment/Interventions Contrast Bath;ADLs/Self Care Home Management;Cryotherapy;Electrical Stimulation;Iontophoresis 35m/ml Dexamethasone;Moist Heat;Ultrasound;Traction;Functional mobility training;Therapeutic activities;Therapeutic exercise;Balance training;Neuromuscular re-education;Patient/family education;Manual techniques;Passive range of motion;Dry needling;Taping    PT Next Visit Plan Assess response to update HEP, continue glute strengthening, core/hip strengthening, pilates, gross hip abductor/ extensor strengthening    PT Home Exercise Plan [id number] -  supine marching, sidelying hip abduction, lower trunk rotatin, (supine/ standing) hip flexor stretch with strap, towel roll piriformis release, heel strike/ toe off pattern, hip flx to 90+abd stretch, supine IR stretch foot anchored, bridge with green band on heels, hooklying clamshell with green, quadruped donkey kick    Consulted and Agree with Plan of Care Patient           Patient will benefit from skilled therapeutic intervention in order to improve the following deficits and impairments:  Improper body mechanics, Increased muscle spasms, Decreased strength, Pain, Postural dysfunction, Decreased activity tolerance, Decreased endurance, Decreased range of motion, Abnormal gait  Visit Diagnosis: Pain in right hip  Chronic low back pain, unspecified back pain laterality, unspecified whether sciatica present  Muscle weakness (generalized)  Other abnormalities of gait and mobility     Problem List Patient Active Problem List   Diagnosis Date Noted  . Unilateral primary osteoarthritis, right hip 08/15/2019    Cheryl Hendricks C. Andrey Hoobler PT, DPT 09/11/19 2:16 PM   [facility] [place] [address]Phone: [phone]  Fax:  [phone] Name: Cheryl MuraokaMRN: [id number] of Birth: [birth year]

## 2019-09-13 DIAGNOSIS — N949 Unspecified condition associated with female genital organs and menstrual cycle: Secondary | ICD-10-CM | POA: Diagnosis not present

## 2019-09-18 NOTE — Progress Notes (Signed)
GYNECOLOGIC ONCOLOGY NEW PATIENT CONSULTATION   Patient Name: Cheryl Hendricks  Patient Age: 69 y.o. Date of Service: 09/19/19 Referring Provider: Dr. Christophe Louis  Primary Care Provider: Wenda Low, MD Consulting Provider: Jeral Pinch, MD   Assessment/Plan:  Postmenopausal patient with a mostly cystic adnexal mass.  Patient has a history of being followed for a cystic adnexal mass up until 2010.  Her recollection is that this was on her right ovary.  She has reached out to the health system in [place] and the records have been faxed, hopefully arriving tomorrow.  She will bring [place] the ultrasound reports as well as CDs once they arrive.  She denied discussed that even without this history, the appearance of her cyst on the recent ultrasound is overall very reassuring.  It is cystic with either internal debris or echoes, and the question of a mural nodule.  The patient is asymptomatic in regards to this mass, no free fluid is noted on ultrasound, and she has no family history of GYN or GYN related cancer syndromes.  The patient had a CA-125 that was normal.  We discussed that this is neither a sensitive nor specific test.  It can be elevated and lots of other disease processes that are nonmalignant (like fibroids and endometriosis) and can be normal and up to 50% of early stage ovarian cancer.  With regards to her remote history of cervical dysplasia, the patient has had at least 20 years now of normal cervical cancer screening per her report.  We discussed that at this time, given her age of 82, I do not recommend continued surveillance.  It would be reasonable for her to have a yearly pelvic exam and Pap testing or other work-up could be done if she were to develop any new symptoms such as vaginal bleeding or discharge.  I have recommended repeat imaging in 3 months after her recent ultrasound to assure no change in the size or character of her adnexal cyst.  I will plan to call her once I have  reviewed her outside records from [place].  She knows to call the clinic if anything changes before her repeat scheduled ultrasound.  All of her questions and concerns were answered today.  A copy of this note was sent to the patient's referring provider.  55 minutes of total time was spent for this patient encounter, including preparation, face-to-face counseling with the patient and coordination of care, and documentation of the encounter.   Jeral Pinch, MD  Division of Gynecologic Oncology  Department of Obstetrics and Gynecology  [facility]  ___________________________________________  Chief Complaint: No chief complaint on file.   History of Present Illness:  Cheryl Hendricks is a 69 y.o. y.o. female who is seen in consultation at the request of Dr. Landry Mellow for an evaluation of an adnexal cyst.  The patient has a history of an ovarian cyst requiring surgery at age 72 in 79.  This was a cystic mass removed from her left ovary.  Closer to the age of menopause, she developed a cyst she thinks on her right ovary.  She had approximately 5 pelvic ultrasounds in [place] state prior to 2010 before she moved to the [facility].  She recently was undergoing imaging for her right hip with an incidental finding of a right cystic adnexal mass.  She denies any change to her appetite or weight.  She has been trying to lose weight and is following now a mostly vegan and plant-based diet.  She denies any nausea or early satiety.  She reports regular bowel movements.  She has some intermittent difficulty emptying her bladder as well as stress incontinence, both unchanged.  She found out at the time of her colonoscopy in 2019 that she had a hiatal hernia.  Her medical history is notable for diabetes diagnosed in 2003.  She also had a cardiac ablation in 2011 for frequent PVCs.  She has been asymptomatic since that time.  She denies any shortness of breath or chest pain at rest  or with ambulation.  Her surgical history is notable for removal of a left ovarian cyst in 1973 with an appendectomy at the same time.  In 1996, she underwent bilateral tubal ligation.  [place].  Her cousin and cousin's spouse live in her guest house.  She has a remote history of tobacco use and quit over 40 years ago. The patient works currently for [employer]. 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   PAST MEDICAL HISTORY:  Past Medical History:  Diagnosis Date  . Adnexal mass   . Colon polyps   . Diabetes mellitus without complication ([place])   . History of cervical dysplasia   . HLD (hyperlipidemia)   . HTN (hypertension)   . Osteopenia   . PVCs (premature ventricular contractions)    s/p cardiac ablation in 2011     PAST SURGICAL HISTORY:  Past Surgical History:  Procedure Laterality Date  . APPENDECTOMY     at same time as her left ovarian cyst removal  . CARDIAC ELECTROPHYSIOLOGY STUDY AND ABLATION    . OVARIAN CYST REMOVAL  1973  . TONSILLECTOMY    . TUBAL LIGATION  1996    OB/GYN HISTORY:  OB History  Gravida Para Term Preterm AB Living  4 2     2     SAB TAB Ectopic Multiple Live Births  1   1        # Outcome Date GA Lbr Len/2nd Weight Sex Delivery Anes PTL Lv  4 SAB           3 Para           2 Para           1 Ectopic             No LMP recorded. Patient is postmenopausal.  Age at menarche: 69  Age at menopause: 68 Hx of HRT: no Hx of STDs: no Last pap: 06/2018, negative; HPV negative History of abnormal pap smears: remote history of HPV infection in the 1970s; underwent cryotherapy for cervical dysplasia and had subsequent abnormal paps in the 1980s; no abnormals after 1990  SCREENING STUDIES:  Last mammogram: 06/2019  Last colonoscopy: 12/2017 - due again in 2024 Last bone mineral density: 2021  MEDICATIONS: Outpatient Encounter Medications as of 09/19/2019   Medication Sig  . Alpha-Lipoic Acid 600 MG TABS Take by mouth.  . Ascorbic Acid (VITAMIN C) 1000 MG tablet Take 1,000 mg by mouth daily.  Marland Kitchen aspirin 325 MG tablet Take 325 mg by mouth daily.  Marland Kitchen atorvastatin (LIPITOR) 40 MG tablet Take 40 mg by mouth daily.  . BENFOTIAMINE PO Take 600 mg by mouth daily.  . Calcium Carbonate (CALCIUM-CARB 600 PO) Take 1,200 mg by mouth daily.  . Cholecalciferol (VITAMIN D3) 125 MCG (5000 UT) TABS Take 1 tablet by mouth 4 (four) times a week.  Marland Kitchen  empagliflozin (JARDIANCE) 10 MG TABS tablet Take by mouth daily.  Marland Kitchen losartan (COZAAR) 50 MG tablet Take 50 mg by mouth daily.  . magnesium gluconate (MAGONATE) 500 MG tablet Take 500 mg by mouth daily.  . metFORMIN (GLUCOPHAGE) 1000 MG tablet Take 1,000 mg by mouth 2 (two) times daily with a meal.  . Multiple Vitamins-Minerals (MULTIVITAMIN WOMENS 50+ ADV) TABS Take 1 tablet by mouth daily.  . Omega-3 Fatty Acids (FISH OIL) 1200 MG CAPS Take by mouth.  . Turmeric 500 MG TABS Take 1,000 mg by mouth in the morning and at bedtime.  Marland Kitchen zinc gluconate 50 MG tablet Take 50 mg by mouth daily.  . [DISCONTINUED] hydrochlorothiazide (MICROZIDE) 12.5 MG capsule Take 12.5 mg by mouth daily.   Marland Kitchen glucosamine-chondroitin 500-400 MG tablet Take 1 tablet by mouth in the morning and at bedtime. Triple strength  . hydrochlorothiazide (HYDRODIURIL) 12.5 MG tablet Take 12.5 mg by mouth daily.  . [DISCONTINUED] benazepril-hydrochlorthiazide (LOTENSIN HCT) 10-12.5 MG tablet Take 1 tablet by mouth daily. (Patient not taking: Reported on 08/23/2019)  . [DISCONTINUED] calcium carbonate (OS-CAL - DOSED IN MG OF ELEMENTAL CALCIUM) 1250 (500 Ca) MG tablet Take 1 tablet by mouth.  . [DISCONTINUED] Cholecalciferol (VITAMIN D3) 1.25 MG (50000 UT) TABS Take by mouth.  . [DISCONTINUED] collagenase (SANTYL) ointment Apply 1 application topically daily. (Patient not taking: Reported on 08/23/2019)  . [DISCONTINUED] magnesium 30 MG tablet Take 500 mg by mouth.  Every other day at bedtime   No facility-administered encounter medications on file as of 09/19/2019.    ALLERGIES:  Allergies  Allergen Reactions  . Ondansetron Hcl Anaphylaxis  . Lisinopril     Other reaction(s): Cough, cough  . Rosuvastatin Calcium Other (See Comments)    Elevated LFT's     FAMILY HISTORY:  Family History  Problem Relation Age of Onset  . Stroke Mother   . Hypertension Father   . Melanoma Brother   . Ovarian cancer Neg Hx   . Uterine cancer Neg Hx   . Breast cancer Neg Hx   . Colon cancer Neg Hx      SOCIAL HISTORY:    Social Connections:   . Frequency of Communication with Friends and Family: Not on file  . Frequency of Social Gatherings with Friends and Family: Not on file  . Attends Religious Services: Not on file  . Active Member of Clubs or Organizations: Not on file  . Attends Archivist Meetings: Not on file  . Marital Status: Not on file    REVIEW OF SYSTEMS:  + R hip pain Denies appetite changes, fevers, chills, fatigue, unexplained weight changes. Denies hearing loss, neck lumps or masses, mouth sores, ringing in ears or voice changes. Denies cough or wheezing.  Denies shortness of breath. Denies chest pain or palpitations. Denies leg swelling. Denies abdominal distention, pain, blood in stools, constipation, diarrhea, nausea, vomiting, or early satiety. Denies pain with intercourse, dysuria, frequency, hematuria or incontinence. Denies hot flashes, pelvic pain, vaginal bleeding or vaginal discharge.   Denies back pain or muscle pain/cramps. Denies itching, rash, or wounds. Denies dizziness, headaches, numbness or seizures. Denies swollen lymph nodes or glands, denies easy bruising or bleeding. Denies anxiety, depression, confusion, or decreased concentration.  Physical Exam:  Vital Signs for this encounter:  Blood pressure (!) 149/63, pulse 66, temperature 98.8 F (37.1 C), temperature source Tympanic, resp. rate 18,  height 5\' 7"  (1.702 m), weight 177 lb (80.3 kg), SpO2 99 %. Body mass index is  27.72 kg/m. General: Alert, oriented, no acute distress.  HEENT: Normocephalic, atraumatic. Sclera anicteric.  Chest: Clear to auscultation bilaterally. No wheezes, rhonchi, or rales. Cardiovascular: Regular rate and rhythm, no murmurs, rubs, or gallops.  Abdomen: Normoactive bowel sounds. Soft, nondistended, nontender to palpation. No masses or hepatosplenomegaly appreciated. No palpable fluid wave.  Extremities: Grossly normal range of motion. Warm, well perfused. No edema bilaterally.  Skin: No rashes or lesions.  Lymphatics: No cervical, supraclavicular, or inguinal adenopathy.  GU:  Normal external female genitalia. No lesions. No discharge or bleeding.             Bladder/urethra:  No lesions or masses, well supported bladder             Vagina: Mildly atrophic vaginal mucosa.             Cervix: Normal appearing, no lesions. Posterior facing. On bimanual exam, nabothian cyst palpated on posterior lip of the cervix.             Uterus: Small, mobile, no parametrial involvement or nodularity.             Adnexa: Fullness noted in the cul-de-sac in the midline and to the right, smooth.  Rectal: deferred.  LABORATORY AND RADIOLOGIC DATA:  Outside medical records were reviewed to synthesize the above history, along with the history and physical obtained during the visit.   No results found for: WBC, HGB, HCT, PLT, GLUCOSE, CHOL, TRIG, HDL, LDLDIRECT, LDLCALC, ALT, AST, NA, K, CL, CREATININE, BUN, CO2, TSH, PSA, INR, GLUF, HGBA1C, MICROALBUR  Pelvic ultrasound 08/2019: IMPRESSION: Unremarkable uterus, endometrial complex and LEFT ovary. 6.4 cm diameter cystic mass within RIGHT adnexa containing either internal echoes versus artifacts and a questionable mural nodule. Either MR characterization or surgical evaluation recommended.   CA-125 was 10.9.

## 2019-09-19 ENCOUNTER — Encounter: Payer: Self-pay | Admitting: Gynecologic Oncology

## 2019-09-19 ENCOUNTER — Inpatient Hospital Stay: Payer: Medicare Other | Attending: Gynecologic Oncology | Admitting: Gynecologic Oncology

## 2019-09-19 ENCOUNTER — Other Ambulatory Visit: Payer: Self-pay

## 2019-09-19 VITALS — BP 149/63 | HR 66 | Temp 98.8°F | Resp 18 | Ht 67.0 in | Wt 177.0 lb

## 2019-09-19 DIAGNOSIS — Z87410 Personal history of cervical dysplasia: Secondary | ICD-10-CM | POA: Insufficient documentation

## 2019-09-19 DIAGNOSIS — I10 Essential (primary) hypertension: Secondary | ICD-10-CM | POA: Insufficient documentation

## 2019-09-19 DIAGNOSIS — E119 Type 2 diabetes mellitus without complications: Secondary | ICD-10-CM | POA: Insufficient documentation

## 2019-09-19 DIAGNOSIS — Z7982 Long term (current) use of aspirin: Secondary | ICD-10-CM | POA: Insufficient documentation

## 2019-09-19 DIAGNOSIS — N393 Stress incontinence (female) (male): Secondary | ICD-10-CM | POA: Insufficient documentation

## 2019-09-19 DIAGNOSIS — M8580 Other specified disorders of bone density and structure, unspecified site: Secondary | ICD-10-CM | POA: Diagnosis not present

## 2019-09-19 DIAGNOSIS — E785 Hyperlipidemia, unspecified: Secondary | ICD-10-CM | POA: Diagnosis not present

## 2019-09-19 DIAGNOSIS — Z7984 Long term (current) use of oral hypoglycemic drugs: Secondary | ICD-10-CM | POA: Insufficient documentation

## 2019-09-19 DIAGNOSIS — Z87891 Personal history of nicotine dependence: Secondary | ICD-10-CM | POA: Diagnosis not present

## 2019-09-19 DIAGNOSIS — Z79899 Other long term (current) drug therapy: Secondary | ICD-10-CM | POA: Diagnosis not present

## 2019-09-19 DIAGNOSIS — N9489 Other specified conditions associated with female genital organs and menstrual cycle: Secondary | ICD-10-CM | POA: Insufficient documentation

## 2019-09-19 NOTE — Patient Instructions (Signed)
It was a pleasure meeting you today. I'll in touch once I have reviewed your records. We will plan to get a follow-up ultrasound 3 months after your recent one in August. If you have any new concerns or symptoms, call the clinic at [phone].

## 2019-09-21 ENCOUNTER — Other Ambulatory Visit: Payer: Self-pay

## 2019-09-21 ENCOUNTER — Encounter: Payer: Self-pay | Admitting: Physical Therapy

## 2019-09-21 ENCOUNTER — Ambulatory Visit: Payer: Medicare Other | Attending: Orthopaedic Surgery | Admitting: Physical Therapy

## 2019-09-21 DIAGNOSIS — M545 Low back pain, unspecified: Secondary | ICD-10-CM

## 2019-09-21 DIAGNOSIS — G8929 Other chronic pain: Secondary | ICD-10-CM | POA: Insufficient documentation

## 2019-09-21 DIAGNOSIS — M25551 Pain in right hip: Secondary | ICD-10-CM | POA: Insufficient documentation

## 2019-09-21 DIAGNOSIS — M6281 Muscle weakness (generalized): Secondary | ICD-10-CM | POA: Diagnosis not present

## 2019-09-21 DIAGNOSIS — R2689 Other abnormalities of gait and mobility: Secondary | ICD-10-CM

## 2019-09-21 NOTE — Therapy (Signed)
[place], [place], [address] Phone: [phone]   Fax:  [phone]  Physical Therapy Treatment  Patient Details  Name: Cheryl Hendricks MRN: [id number] Date of Birth: 1950-09-20 Referring Provider (PT): Jean Rosenthal MD   Encounter Date: 09/21/2019   PT End of Session - 09/21/19 1206    Visit Number 17    Number of Visits 19    Date for PT Re-Evaluation 10/03/19    Authorization Type MCR: Kx at mod at 15th visit    Progress Note Due on Visit 19    PT Start Time 1159   pt arrived late   PT Stop Time 1230    PT Time Calculation (min) 31 min    Activity Tolerance Patient tolerated treatment well           Past Medical History:  Diagnosis Date  . Adnexal mass   . Colon polyps   . Diabetes mellitus without complication ([place])   . History of cervical dysplasia   . HLD (hyperlipidemia)   . HTN (hypertension)   . Osteopenia   . PVCs (premature ventricular contractions)    s/p cardiac ablation in 2011    Past Surgical History:  Procedure Laterality Date  . APPENDECTOMY     at same time as her left ovarian cyst removal  . CARDIAC ELECTROPHYSIOLOGY STUDY AND ABLATION    . OVARIAN CYST REMOVAL  1973  . TONSILLECTOMY    . TUBAL LIGATION  1996    There were no vitals filed for this visit.   Subjective Assessment - 09/21/19 1202    Subjective I was standing at the [place] center for 12 hours yesterday so I am exhausted. seeing ortho surgeon next week. I thought I would be in agony from yesterday but I am feeling okay.    Patient Stated Goals to decrease the pain,    Currently in Pain? Yes    Pain Score 1     Pain Location Hip    Pain Orientation Right    Pain Descriptors / Indicators Sore    Aggravating Factors  long term standing    Pain Relieving Factors rest                             [facility] Adult PT Treatment/Exercise - 09/21/19 0001      Exercises   Exercises Other  Exercises      Pilates   Pilates Reformer see PT note            Pilates Reformer used for LE/core strength, postural strength, lumbopelvic disassociation and core control.  Exercises included:  Footwork- 2 red: double feet press, single foot press, turnout on heels- double & single foot. Press on toes- double & single foot.           PT Short Term Goals - 07/26/19 1144      PT SHORT TERM GOAL #1   Title pt to be I with inital HEp    Period Weeks    Status Achieved             PT Long Term Goals - 09/05/19 1157      PT LONG TERM GOAL #1   Title increase R hip IR/ER by >/= 8 degrees with  </= 2/10 pain for functional hip mobiltiy    Baseline met ROM goal, mild ache at 1/10    Period Weeks    Status  Achieved      PT LONG TERM GOAL #2   Title increase gross R hip strength to >/= 4+/5 to promote stability with walking/ standing    Baseline continued 4-/5 hip extensor weakness, and 3+/5 R hip abductor    Period Weeks    Status Partially Met      PT LONG TERM GOAL #3   Title pt to verbalize/ demo efficient gait pattern with </= 2/10 pain    Baseline continues to demo an antalgic pattern    Period Weeks    Status Partially Met      PT LONG TERM GOAL #4   Title increase FOTO score for back to </=32% limited and hip to </= 29% limited to demo functional improvement    Period Weeks    Status Unable to assess      PT LONG TERM GOAL #5   Title pt to be I with all HEP given to maintain and progress current level of function    Baseline progressing as able.    Period Weeks    Status Partially Met                 Plan - 09/21/19 1238    Clinical Impression Statement Pt presented with significant deviations in gait today after standing for so long yesterday. She reported only mild pain but we utilized the reformer for low impact strengthening that would also encourage activtion of hip extensors. Good tolerance today.    PT Treatment/Interventions Contrast  Bath;ADLs/Self Care Home Management;Cryotherapy;Electrical Stimulation;Iontophoresis 60m/ml Dexamethasone;Moist Heat;Ultrasound;Traction;Functional mobility training;Therapeutic activities;Therapeutic exercise;Balance training;Neuromuscular re-education;Patient/family education;Manual techniques;Passive range of motion;Dry needling;Taping    PT Next Visit Plan continue glute strengthening, core/hip strengthening, pilates, gross hip abductor/ extensor strengthening, seeing Ortho next week    PT Home Exercise Plan [id number] -  supine marching, sidelying hip abduction, lower trunk rotatin, (supine/ standing) hip flexor stretch with strap, towel roll piriformis release, heel strike/ toe off pattern, hip flx to 90+abd stretch, supine IR stretch foot anchored, bridge with green band on heels, hooklying clamshell with green, quadruped donkey kick    Consulted and Agree with Plan of Care Patient           Patient will benefit from skilled therapeutic intervention in order to improve the following deficits and impairments:  Improper body mechanics, Increased muscle spasms, Decreased strength, Pain, Postural dysfunction, Decreased activity tolerance, Decreased endurance, Decreased range of motion, Abnormal gait  Visit Diagnosis: Pain in right hip  Chronic low back pain, unspecified back pain laterality, unspecified whether sciatica present  Muscle weakness (generalized)  Other abnormalities of gait and mobility     Problem List Patient Active Problem List   Diagnosis Date Noted  . History of cervical dysplasia 09/19/2019  . Adnexal mass 09/19/2019  . Unilateral primary osteoarthritis, right hip 08/15/2019    Cheryl Hendricks C. Algernon Mundie PT, DPT 09/21/19 12:40 PM   [place] [address]: [phone]  Fax:  [phone] Name: MTicara WanerMRN: [id number] of Birth: [birth year]

## 2019-09-23 MED ORDER — TRIAMCINOLONE ACETONIDE 40 MG/ML IJ SUSP
60.0000 mg | INTRAMUSCULAR | Status: AC | PRN
  Administered 2019-08-23: 60 mg via INTRA_ARTICULAR

## 2019-09-23 MED ORDER — BUPIVACAINE HCL 0.25 % IJ SOLN
4.0000 mL | INTRAMUSCULAR | Status: AC | PRN
  Administered 2019-08-23: 4 mL via INTRA_ARTICULAR

## 2019-09-24 ENCOUNTER — Encounter: Payer: Self-pay | Admitting: Gynecologic Oncology

## 2019-09-24 ENCOUNTER — Telehealth: Payer: Self-pay

## 2019-09-24 ENCOUNTER — Ambulatory Visit: Payer: Medicare Other | Admitting: Physical Therapy

## 2019-09-24 NOTE — Progress Notes (Signed)
Review of prior ultrasounds from [facility]  Pelvic ultrasound exam on 02/28/2003: Normal thickness and appearance of the endometrium.  Normal-sized uterus.  At least 3 small fibroids. 3.5 cm simple right adnexal cyst likely related to the right ovary.  Recommend follow-up in 4-6 months to ensure stability or regression.  Pelvic ultrasound exam on 07/18/2003:  Slight interval enlargement of the 3.  8 cm unilocular right ovarian simple cyst without suspicious features.  Pelvic ultrasound exam on 06/01/2004: Uterus measures 9.1 x 3.9 x 5.6 cm with a posterior body intramural fibroid measuring up to 1 cm.  Other small fibroids previously noted are not well seen.  Endometrium is normal in thickness and homogeneous measuring 9 mm.  Essentially no change in a 3.3 x 1.9 x 3.6 cm dominant simple right ovarian cyst without complex features.  Left ovary is normal-appearing measuring 4.2 x 1.6 x 3.0 cm with a dominant 1.7 x 1.1 x 1.5 cm simple follicle.  No free fluid.  Pelvic ultrasound exam on 03/14/2006: Stable 3.6 x 2.9 x 3.1 cm unilocular right ovarian cyst.  Given stability dating back to February 2005, thought to almost certainly be benign.  Solitary subcentimeter fibroid.  Pelvic ultrasound exam on 03/25/2008: Anteverted normal-sized uterus with small posterior intramural fibroid measuring 9 mm, stable.  Thin postmenopausal appearing endometrium measuring 2.7 mm.  Right ovary is largely occupied by a cyst measuring 4.4 x 3.1 x 3.4 cm which has slightly increased in size.  There is a small echogenic focus that is seen inferiorly within the cyst measuring approximately 2 mm.  Jeral Pinch MD Gynecologic Oncology

## 2019-09-24 NOTE — Telephone Encounter (Signed)
Told ms Cheryl Hendricks that Dr. Reviewed the [place] that she dropped off.  The cyst was 4.5 cm in 2010 and now it is 6.4 cm and looks very similar. Dr. Berline Lopes recommends the same plan as [place] in November as scheduled.  Pt appreciated the follow up call.

## 2019-09-25 DIAGNOSIS — Z23 Encounter for immunization: Secondary | ICD-10-CM | POA: Diagnosis not present

## 2019-09-26 ENCOUNTER — Ambulatory Visit (INDEPENDENT_AMBULATORY_CARE_PROVIDER_SITE_OTHER): Payer: Medicare Other | Admitting: Orthopaedic Surgery

## 2019-09-26 ENCOUNTER — Encounter: Payer: Self-pay | Admitting: Orthopaedic Surgery

## 2019-09-26 DIAGNOSIS — M1611 Unilateral primary osteoarthritis, right hip: Secondary | ICD-10-CM | POA: Diagnosis not present

## 2019-09-26 NOTE — Progress Notes (Signed)
The patient is very active and pleasant 69 year old female well-known to me.  She does have well-documented osteoarthritis of her right hip.  She is about 5 weeks out from an intra-articular steroid injection done under fluoroscopy by Dr. Ernestina Patches.  She says that was very helpful for her.  She still hobbles when she walks with her gait.  She is been going through outpatient physical therapy and there plan to transition her to a home exercise program.  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  Examination of her right hip does still show some pain with internal and external rotation but she has good motion with that hip.  She also is walking with a lift in her left shoe.  At this point follow-up can be as needed since she is doing well.  She understands that I would recommend a hip replacement if he gets to the point where her right hip pain is detriment affecting her mobility, her quality of life and her activities day living on a daily basis.  All question concerns were answered and addressed.  Follow-up can be as needed.

## 2019-10-03 ENCOUNTER — Ambulatory Visit: Payer: Medicare Other | Admitting: Physical Therapy

## 2019-10-03 ENCOUNTER — Other Ambulatory Visit: Payer: Self-pay

## 2019-10-03 DIAGNOSIS — M545 Low back pain, unspecified: Secondary | ICD-10-CM

## 2019-10-03 DIAGNOSIS — M25551 Pain in right hip: Secondary | ICD-10-CM

## 2019-10-03 DIAGNOSIS — M6281 Muscle weakness (generalized): Secondary | ICD-10-CM | POA: Diagnosis not present

## 2019-10-03 DIAGNOSIS — R2689 Other abnormalities of gait and mobility: Secondary | ICD-10-CM | POA: Diagnosis not present

## 2019-10-03 DIAGNOSIS — G8929 Other chronic pain: Secondary | ICD-10-CM | POA: Diagnosis not present

## 2019-10-03 NOTE — Therapy (Signed)
[place], [place], [address] Phone: [phone]   Fax:  [phone]  Physical Therapy Treatment / Discharge  Patient Details  Name: Cheryl Hendricks MRN: [id number] Date of Birth: 1950/12/01 Referring Provider (PT): Jean Rosenthal MD   Encounter Date: 10/03/2019   PT End of Session - 10/03/19 1101    Visit Number 18    Number of Visits 19    Date for PT Re-Evaluation 10/03/19    Authorization Type MCR: Kx at mod at 15th visit    Progress Note Due on Visit 19    PT Start Time 1100    PT Stop Time 1144    PT Time Calculation (min) 44 min    Activity Tolerance Patient tolerated treatment well    Behavior During Therapy [facility] for tasks assessed/performed           Past Medical History:  Diagnosis Date  . Adnexal mass   . Colon polyps   . Diabetes mellitus without complication ([place])   . History of cervical dysplasia   . HLD (hyperlipidemia)   . HTN (hypertension)   . Osteopenia   . PVCs (premature ventricular contractions)    s/p cardiac ablation in 2011    Past Surgical History:  Procedure Laterality Date  . APPENDECTOMY     at same time as her left ovarian cyst removal  . CARDIAC ELECTROPHYSIOLOGY STUDY AND ABLATION    . OVARIAN CYST REMOVAL  1973  . TONSILLECTOMY    . TUBAL LIGATION  1996    There were no vitals filed for this visit.   Subjective Assessment - 10/03/19 1102    Subjective " I saw the DR and they wanted to wait atleast 5-6 months to see if the injection helped before he would do a total hip. I am still doing pretty good with pain, but I need to get more consistent with my exercises."    Patient Stated Goals to decrease the pain,    Currently in Pain? Yes              [facility] PT Assessment - 10/03/19 0001      Assessment   Medical Diagnosis  Low back pain, unspecified back pain laterality, unspecified chronicity, unspecified whether sciatica present , R hip pain     Referring Provider (PT) Jean Rosenthal MD      Observation/Other Assessments   Focus on Therapeutic Outcomes (FOTO)  back 6% limited, hip 46% limited      Strength   Right Hip Flexion 4+/5    Right Hip Extension 4-/5    Right Hip External Rotation  4+/5    Right Hip Internal Rotation 4+/5    Right Hip ABduction 3+/5                         [facility] Adult PT Treatment/Exercise - 10/03/19 0001      Knee/Hip Exercises: Standing   Gait Training heel strike/ toe off with reciprocal arm swing using trekking poles. tacile cues to promote pelvic rotation with stepping    cues to take smaller steps     Knee/Hip Exercises: Seated   Marching 2 sets;10 reps   with blue band around knees   Sit to Sand 2 sets;15 reps   1 set with gree and blue                    PT Short Term Goals - 07/26/19  [place] #1   Title pt to be I with inital HEp    Period Weeks    Status Achieved             PT Long Term Goals - 10/03/19 1104      PT LONG TERM GOAL #1   Title increase R hip IR/ER by >/= 8 degrees with  </= 2/10 pain for functional hip mobiltiy    Period Weeks    Status Achieved      PT LONG TERM GOAL #2   Title increase gross R hip strength to >/= 4+/5 to promote stability with walking/ standing    Baseline continued 4-/5 hip extensor weakness, and 3+/5 R hip abductor    Period Weeks    Status Partially Met      PT LONG TERM GOAL #3   Title pt to verbalize/ demo efficient gait pattern with </= 2/10 pain    Period Weeks    Status Partially Met      PT LONG TERM GOAL #4   Title increase FOTO score for back to </=32% limited and hip to </= 29% limited to demo functional improvement    Period Weeks    Status Partially Met      PT LONG TERM GOAL #5   Title pt to be I with all HEP given to maintain and progress current level of function    Baseline not consistent    Period Weeks    Status Partially Met                 Plan  - 10/03/19 1247    Clinical Impression Statement [name] has made progress with physical therapy increasing hip ROM and strengthening with hip flexion. she continues to demonstrated trendelenberg gait pattern with limited stance on the RLE compared bil, and weakness in R hip extensors/ abductors. reviewed gait training which frequent cues were needed to take smaller steps and reduce trendelenberg pattern, and reviewed hip strengthening exercises which she did well but fatigued quickly. patient has maximized her benefit from skilled physical therapy and is able to maintain her progress her function independently with her HEP and will be discharged from PT today.    PT Treatment/Interventions Contrast Bath;ADLs/Self Care Home Management;Cryotherapy;Electrical Stimulation;Iontophoresis 56m/ml Dexamethasone;Moist Heat;Ultrasound;Traction;Functional mobility training;Therapeutic activities;Therapeutic exercise;Balance training;Neuromuscular re-education;Patient/family education;Manual techniques;Passive range of motion;Dry needling;Taping    PT Next Visit Plan D/C    PT Home Exercise Plan [id number] -  supine marching, sidelying hip abduction, lower trunk rotatin, (supine/ standing) hip flexor stretch with strap, towel roll piriformis release, heel strike/ toe off pattern, hip flx to 90+abd stretch, supine IR stretch foot anchored, bridge with green band on heels, hooklying clamshell with green, quadruped donkey kick    Consulted and Agree with Plan of Care Patient           Patient will benefit from skilled therapeutic intervention in order to improve the following deficits and impairments:  Improper body mechanics, Increased muscle spasms, Decreased strength, Pain, Postural dysfunction, Decreased activity tolerance, Decreased endurance, Decreased range of motion, Abnormal gait  Visit Diagnosis: Pain in right hip  Chronic low back pain, unspecified back pain laterality, unspecified whether sciatica  present  Muscle weakness (generalized)  Other abnormalities of gait and mobility     Problem List Patient Active Problem List   Diagnosis Date Noted  . History of cervical dysplasia 09/19/2019  . Adnexal mass 09/19/2019  . Unilateral  primary osteoarthritis, right hip 08/15/2019    [name] 10/03/2019, 12:59 PM  [facility] [address] [place], [place], [address] Phone: [phone]   Fax:  [phone]  Name: Cheryl Hendricks MRN: [id number] Date of Birth: 10-Dec-1950     PHYSICAL THERAPY DISCHARGE SUMMARY  Visits from Start of Care: 18  Current functional level related to goals / functional outcomes: See goals, back 6% limited, hip 46% limited   Remaining deficits: See assessment   Education / Equipment: HEP, theraband, posture, gait training,   Plan: Patient agrees to discharge.  Patient goals were partially met. Patient is being discharged due to meeting the stated rehab goals.  ?????         Luree Palla PT, DPT, LAT, ATC  10/03/19  12:59 PM

## 2019-10-10 DIAGNOSIS — Z23 Encounter for immunization: Secondary | ICD-10-CM | POA: Diagnosis not present

## 2019-11-15 ENCOUNTER — Telehealth: Payer: Self-pay | Admitting: *Deleted

## 2019-11-15 NOTE — Telephone Encounter (Signed)
Patient called to make sure that her [place] was approved through her insurance company. Patient was given in instructions where to go for her exam and to have a full bladder

## 2019-11-15 NOTE — Telephone Encounter (Deleted)
Patient called to

## 2019-11-16 ENCOUNTER — Other Ambulatory Visit: Payer: Self-pay

## 2019-11-16 ENCOUNTER — Ambulatory Visit (HOSPITAL_COMMUNITY)
Admission: RE | Admit: 2019-11-16 | Discharge: 2019-11-16 | Disposition: A | Discharge status: Home / Self Care | Payer: Medicare Other | Source: Ambulatory Visit | Attending: Gynecologic Oncology | Admitting: Gynecologic Oncology

## 2019-11-16 DIAGNOSIS — N9489 Other specified conditions associated with female genital organs and menstrual cycle: Secondary | ICD-10-CM | POA: Insufficient documentation

## 2019-11-16 DIAGNOSIS — N83201 Unspecified ovarian cyst, right side: Secondary | ICD-10-CM | POA: Diagnosis not present

## 2019-11-19 ENCOUNTER — Inpatient Hospital Stay: Payer: Medicare Other | Attending: Gynecologic Oncology | Admitting: Gynecologic Oncology

## 2019-11-19 ENCOUNTER — Encounter: Payer: Self-pay | Admitting: Gynecologic Oncology

## 2019-11-19 DIAGNOSIS — Z87410 Personal history of cervical dysplasia: Secondary | ICD-10-CM

## 2019-11-19 DIAGNOSIS — D399 Neoplasm of uncertain behavior of female genital organ, unspecified: Secondary | ICD-10-CM | POA: Diagnosis not present

## 2019-11-19 DIAGNOSIS — N9489 Other specified conditions associated with female genital organs and menstrual cycle: Secondary | ICD-10-CM | POA: Diagnosis not present

## 2019-11-19 NOTE — Progress Notes (Signed)
Gynecologic Oncology Telehealth Consult Note: Gyn-Onc  I connected with Cheryl Hendricks on 11/19/19 at  4:00 PM EST by telephone and verified that I am speaking with the correct person using two identifiers.  I discussed the limitations, risks, security and privacy concerns of performing an evaluation and management service by telemedicine and the availability of in-person appointments. I also discussed with the patient that there may be a patient responsible charge related to this service. The patient expressed understanding and agreed to proceed.  Other persons participating in the visit and their role in the encounter: none.  Patient's location: home Provider's location: [facility]  Reason for Visit: Follow-up visit after repeat pelvic ultrasound in the setting of an adnexal mass  Treatment History: The patient has a history of an ovarian cyst requiring surgery at age 77 in 71.  This was a cystic mass removed from her left ovary.  Closer to the age of menopause, she developed a cyst she thinks on her right ovary.  She had approximately 5 pelvic ultrasounds in [place] state prior to 2010 before she moved to the [facility].  She recently was undergoing imaging for her right hip with an incidental finding of a right cystic adnexal mass.    CA-125 on 09/05/2019 was 10.9.  Imaging History: Pelvic ultrasound exam on 02/28/2003: Normal thickness and appearance of the endometrium.  Normal-sized uterus.  At least 3 small fibroids. 3.5 cm simple right adnexal cyst likely related to the right ovary.  Recommend follow-up in 4-6 months to ensure stability or regression.  Pelvic ultrasound exam on 07/18/2003: Slight interval enlargement of the 3.  8 cm unilocular right ovarian simple cyst without suspicious features.  Pelvic ultrasound exam on 06/01/2004: Uterus measures 9.1 x 3.9 x 5.6 cm with a posterior body intramural fibroid measuring up to 1 cm.  Other small fibroids previously noted are not well seen.   Endometrium is normal in thickness and homogeneous measuring 9 mm.  Essentially no change in a 3.3 x 1.9 x 3.6 cm dominant simple right ovarian cyst without complex features.  Left ovary is normal-appearing measuring 4.2 x 1.6 x 3.0 cm with a dominant 1.7 x 1.1 x 1.5 cm simple follicle.  No free fluid.  Pelvic ultrasound exam on 03/14/2006: Stable 3.6 x 2.9 x 3.1 cm unilocular right ovarian cyst.  Given stability dating back to February 2005, thought to almost certainly be benign.  Solitary subcentimeter fibroid.  Pelvic ultrasound exam on 03/25/2008: Anteverted normal-sized uterus with small posterior intramural fibroid measuring 9 mm, stable.  Thin postmenopausal appearing endometrium measuring 2.7 mm.  Right ovary is largely occupied by a cyst measuring 4.4 x 3.1 x 3.4 cm which has slightly increased in size.  There is a small echogenic focus that is seen inferiorly within the cyst measuring approximately 2 mm.  Pelvic ultrasound exam on 08/23/2019: Anteverted normal appearing uterus.  Left ovary normal in size and morphology.  No free fluid.  Cystic mass identified within the right adnexa measuring 6.4 x 5.3 x 5.9 cm.  Scattered internal echoes versus artifact.  Echogenic focus along a single wall, question calcification versus echogenic mural nodule.  Interval History: The patient reports doing well since her last visit with me.  She denies any new symptoms including abdominal pain, pelvic pain, vaginal bleeding, discharge.  She endorses a good appetite without nausea or emesis.  She reports regular bowel and bladder function.  She continues to report pelvic organ prolapse but denies being bothered or symptomatic from  this.  Shortly after her visit with me, she saw her orthopedist and notes that she has had some improvement in her hip pain after a steroid injection.  The patient is very much looking forward to a multi week trip to [place] in December.  Past Medical/Surgical History: Past Medical  History:  Diagnosis Date  . Adnexal mass   . Colon polyps   . Diabetes mellitus without complication ([place])   . History of cervical dysplasia   . HLD (hyperlipidemia)   . HTN (hypertension)   . Osteopenia   . PVCs (premature ventricular contractions)    s/p cardiac ablation in 2011    Past Surgical History:  Procedure Laterality Date  . APPENDECTOMY     at same time as her left ovarian cyst removal  . CARDIAC ELECTROPHYSIOLOGY STUDY AND ABLATION    . OVARIAN CYST REMOVAL  1973  . TONSILLECTOMY    . TUBAL LIGATION  1996    Family History  Problem Relation Age of Onset  . Stroke Mother   . Hypertension Father   . Melanoma Brother   . Ovarian cancer Neg Hx   . Uterine cancer Neg Hx   . Breast cancer Neg Hx   . Colon cancer Neg Hx     Social History   Socioeconomic History  . Marital status: Unknown    Spouse name: Not on file  . Number of children: Not on file  . Years of education: Not on file  . Highest education level: Not on file  Occupational History  . Occupation: Works for [employer]  . Smoking status: Former Smoker    Quit date: 1980    Years since quitting: 41.8  . Smokeless tobacco: Never Used  Substance and Sexual Activity  . Alcohol use: Yes    Comment: very occasional  . Drug use: Never  . Sexual activity: Not Currently  Other Topics Concern  . Not on file  Social History Narrative  . Not on file   Social Determinants of Health   Financial Resource Strain:   . Difficulty of Paying Living Expenses: Not on file  Food Insecurity:   . Worried About Charity fundraiser in the Last Year: Not on file  . Ran Out of Food in the Last Year: Not on file  Transportation Needs:   . Lack of Transportation (Medical): Not on file  . Lack of Transportation (Non-Medical): Not on file  Physical Activity:   . Days of Exercise per Week: Not on file  . Minutes of Exercise per Session: Not on file  Stress:   . Feeling of Stress : Not on file   Social Connections:   . Frequency of Communication with Friends and Family: Not on file  . Frequency of Social Gatherings with Friends and Family: Not on file  . Attends Religious Services: Not on file  . Active Member of Clubs or Organizations: Not on file  . Attends Archivist Meetings: Not on file  . Marital Status: Not on file    Current Medications:  Current Outpatient Medications:  .  Alpha-Lipoic Acid 600 MG TABS, Take by mouth., Disp: , Rfl:  .  Ascorbic Acid (VITAMIN C) 1000 MG tablet, Take 1,000 mg by mouth daily., Disp: , Rfl:  .  aspirin 325 MG tablet, Take 325 mg by mouth daily., Disp: , Rfl:  .  atorvastatin (LIPITOR) 40 MG tablet, Take 40 mg by mouth daily., Disp: , Rfl:  .  BENFOTIAMINE PO, Take 600 mg by mouth daily., Disp: , Rfl:  .  Calcium Carbonate (CALCIUM-CARB 600 PO), Take 1,200 mg by mouth daily., Disp: , Rfl:  .  Cholecalciferol (VITAMIN D3) 125 MCG (5000 UT) TABS, Take 1 tablet by mouth 4 (four) times a week., Disp: , Rfl:  .  empagliflozin (JARDIANCE) 10 MG TABS tablet, Take by mouth daily., Disp: , Rfl:  .  glucosamine-chondroitin 500-400 MG tablet, Take 1 tablet by mouth in the morning and at bedtime. Triple strength, Disp: , Rfl:  .  hydrochlorothiazide (HYDRODIURIL) 12.5 MG tablet, Take 12.5 mg by mouth daily., Disp: , Rfl:  .  losartan (COZAAR) 50 MG tablet, Take 50 mg by mouth daily., Disp: , Rfl:  .  magnesium gluconate (MAGONATE) 500 MG tablet, Take 500 mg by mouth daily., Disp: , Rfl:  .  metFORMIN (GLUCOPHAGE) 1000 MG tablet, Take 1,000 mg by mouth 2 (two) times daily with a meal., Disp: , Rfl:  .  Multiple Vitamins-Minerals (MULTIVITAMIN WOMENS 50+ ADV) TABS, Take 1 tablet by mouth daily., Disp: , Rfl:  .  Omega-3 Fatty Acids (FISH OIL) 1200 MG CAPS, Take by mouth., Disp: , Rfl:  .  Turmeric 500 MG TABS, Take 1,000 mg by mouth in the morning and at bedtime., Disp: , Rfl:  .  zinc gluconate 50 MG tablet, Take 50 mg by mouth daily., Disp: ,  Rfl:   Review of Symptoms: Pertinent positives as per HPI, otherwise negative.  Physical Exam: There were no vitals taken for this visit. Deferred given limitations of phone visit.  Laboratory & Radiologic Studies: Pelvic ultrasound exam on 11/16/2019: Right adnexa measures 6 x 4.4 x 5.9 cm.  Cyst within the ovary measures 5.8 x 3.9 x 5.4 cm containing a single, nonshadowing 6 mm diameter echogenic focus at the wall.  This could represent calcification or a hyperechoic mural nodule.  No free fluid.  Assessment & Plan: Cheryl Hendricks is a 69 y.o. woman with a cystic adnexal mass.  Reviewed in depth ultrasound findings from the end of last week.  There has been no change in size of the mass (it is slightly smaller than her last ultrasound although we discussed that there is some margin of air in measurement).  The internal echoes that were previously seen are not seen on this current exam.  There continues to be a small spot on the cyst wall that is either a calcification or a mural nodule.  Overall, the patient is doing well without any new symptoms.  She and I discussed possible management options.  I feel very comfortable that this is most likely a benign process given 2 ultrasounds now showing stable findings as well as an MRI that at least on noncontrast imaging did not have concerning features.  The patient understands that we cannot rule out a borderline or malignant condition completely without pathologic confirmation.  I offered that we could repeat one additional ultrasound in 3-4 months.  If the cyst remains stable in character and size and a Ca1 25 continues to be normal at that time, then I think we can discontinue frequent imaging surveillance.  Patient understands the importance of calling me if she were to develop any new symptoms in the interim.  We discussed that if she were to need surgery in the future, my recommendation would be for bilateral salpingo-oophorectomy.  At this time, I do  not see a medical indication for concurrent hysterectomy.  She brought up again the history of  cervical dysplasia.  Given at least 20 years of normal Pap smears after she was treated with cryotherapy and had abnormal Paps for some time after, I think that she does not need continued Pap smear surveillance.  As I had at her visit, I discussed that if she were to have new symptoms such as bleeding, that this would be worked up and might result in a Pap smear and/or biopsy.  I discussed the assessment and treatment plan with the patient. The patient was provided with an opportunity to ask questions and all were answered. The patient agreed with the plan and demonstrated an understanding of the instructions.    The patient was advised to call back or see an in-person evaluation if the symptoms worsen or if the condition fails to improve as anticipated.   28 minutes of total time was spent for this patient encounter, including preparation, face-to-face counseling with the patient and coordination of care, and documentation of the encounter.   Jeral Pinch, MD  Division of Gynecologic Oncology  Department of Obstetrics and Gynecology  [facility]

## 2019-11-20 ENCOUNTER — Telehealth: Payer: Self-pay | Admitting: *Deleted

## 2019-11-20 NOTE — Telephone Encounter (Signed)
Scheduled the patient for an [place] on 03/17/2020 at 11 am. Called the patient and left a message to call the office back

## 2019-12-05 ENCOUNTER — Ambulatory Visit (INDEPENDENT_AMBULATORY_CARE_PROVIDER_SITE_OTHER): Payer: Medicare Other | Admitting: Orthopaedic Surgery

## 2019-12-05 ENCOUNTER — Encounter: Payer: Self-pay | Admitting: Orthopaedic Surgery

## 2019-12-05 VITALS — Ht 67.0 in | Wt 177.0 lb

## 2019-12-05 DIAGNOSIS — M1611 Unilateral primary osteoarthritis, right hip: Secondary | ICD-10-CM | POA: Diagnosis not present

## 2019-12-05 NOTE — Progress Notes (Signed)
The patient is well-known to me.  She is a 69 year old female with debilitating arthritis involving her right hip.  Her plain films were not as remarkable for arthritis but the MRI of her right hip did show significant changes in the cartilage with subchondral cystic changes as well in the femoral head and acetabulum.  There is also degenerative labral tearing.  She has tried and failed all forms conservative treatment at this standpoint including activity modification, outpatient physical therapy, anti-inflammatories and an intra-articular steroid injection.  Her pain has been getting worse.  It is definitely affecting her actives daily living, her mobility and her quality of life.  At this point she does wish to proceed with total hip arthroplasty surgery.  She has had no other acute change in her medical status.  She denies any headache, chest pain, shortness of breath, fever, chills, nausea, vomiting.  I did a complete review of her medicines and her medical and surgical history.  She is alert and orient x3 and in no acute distress  Examination of her right hip shows significant pain with internal and external rotation with the pain being in the groin.  Her left hip appears normal on exam and moves smoothly.  The plain film and MRI findings are as above.  I spent a considerable out on time showing her hip model and explained in detail what hip replacement surgery involves.  I talked about the interoperative postoperative course.  I discussed the risk and benefits of surgery as well.  I gave her handout about hip replacement surgery.  All questions and concerns were answered and addressed.  She is interested in having this scheduled.  We will have our surgery scheduler work on getting it scheduled most likely sometime in early 2022 based on the schedule.

## 2019-12-27 ENCOUNTER — Telehealth: Payer: Self-pay | Admitting: *Deleted

## 2019-12-27 NOTE — Telephone Encounter (Signed)
Called and scheduled the patient a follow up appt in March

## 2020-01-22 ENCOUNTER — Telehealth: Payer: Self-pay | Admitting: *Deleted

## 2020-01-22 NOTE — Telephone Encounter (Signed)
Patient called back and appt moved from 1:15pm to 1:30pm

## 2020-01-22 NOTE — Telephone Encounter (Signed)
Called the patent and left message to call the office back. Need to change the appt time for 3/10

## 2020-01-23 ENCOUNTER — Encounter (HOSPITAL_COMMUNITY): Admission: RE | Admit: 2020-01-23 | Payer: Medicare Other | Source: Ambulatory Visit

## 2020-01-30 ENCOUNTER — Other Ambulatory Visit: Payer: Self-pay

## 2020-01-31 ENCOUNTER — Encounter (HOSPITAL_COMMUNITY): Payer: Self-pay

## 2020-01-31 NOTE — Progress Notes (Addendum)
PCP - Dr. Deforest Hoyles Cardiologist - no  PPM/ICD -  Device Orders -  Rep Notified -   Chest x-ray -  EKG - 02-04-20 epic Stress Test -  ECHO -  Cardiac Cath -   Sleep Study -  CPAP -   Fasting Blood Sugar - 130's Checks Blood Sugar __1___ times a day  Blood Thinner Instructions: Aspirin Instructions:  ERAS Protcol - PRE-SURGERY G2-   COVID TEST- 02-12-20 [place]- Able to do own housework without sob and climb a flight of stairs  Anesthesia review: DM, HTN, endocardial ablation due to PVC'S 12-2009  Report in Care everywhere has not been followed by cardiology since   Patient denies shortness of breath, fever, cough and chest pain at PAT appointment  NONE   All instructions explained to the patient, with a verbal understanding of the material. Patient agrees to go over the instructions while at home for a better understanding. Patient also instructed to self quarantine after being tested for COVID-19. The opportunity to ask questions was provided.

## 2020-01-31 NOTE — Patient Instructions (Signed)
DUE TO COVID-19 ONLY ONE VISITOR IS ALLOWED TO COME WITH YOU AND STAY IN THE WAITING ROOM ONLY DURING PRE OP AND PROCEDURE DAY OF SURGERY. THE 1 VISITOR  MAY VISIT WITH YOU AFTER SURGERY IN YOUR PRIVATE ROOM DURING VISITING HOURS ONLY!  YOU NEED TO HAVE A COVID 19 TEST ON__2-1-22_____ @_______ , THIS TEST MUST BE DONE BEFORE SURGERY,  COVID TESTING SITE [address] [place] [address], IT IS ON THE RIGHT GOING OUT [address] APPROXIMATELY  2 MINUTES PAST [employer] ON THE RIGHT. ONCE YOUR COVID TEST IS COMPLETED,  PLEASE BEGIN THE QUARANTINE INSTRUCTIONS AS OUTLINED IN YOUR HANDOUT.                Cheryl Hendricks  01/31/2020   Your procedure is scheduled on: 02-15-20   Report to [facility] Main  Entrance   Report to admitting at           1100 AM     Call this number if you have problems the morning of surgery [phone]    Remember: NO SOLID FOOD AFTER MIDNIGHT THE NIGHT PRIOR TO SURGERY. NOTHING BY MOUTH EXCEPT CLEAR LIQUIDS UNTIL    1030 am . PLEASE FINISH g2  DRINK PER SURGEON ORDER  WHICH NEEDS TO BE COMPLETED AT 1030 am then nothing by mouth .    CLEAR LIQUID DIET   Foods Allowed                                                                                     Foods Excluded  Black Coffee and tea, regular and decaf                                          liquids that you cannot  Plain Jell-O any favor except red or purple                                           see through such as: Fruit ices (not with fruit pulp)                                                               milk, soups, orange juice  Iced Popsicles                                                                   All solid food Carbonated beverages, regular and diet  Cranberry, grape and apple juices Sports drinks like Gatorade Lightly seasoned clear broth or consume(fat free) Sugar, honey syrup   BRUSH YOUR TEETH MORNING OF SURGERY AND  RINSE YOUR MOUTH OUT, NO CHEWING GUM CANDY OR MINTS.     Take these medicines the morning of surgery with A SIP OF WATER: LIPITOR  Hold jardiance the day before your surgery  DO NOT TAKE ANY DIABETIC MEDICATIONS DAY OF YOUR SURGERY                               You may not have any metal on your body including hair pins and              piercings  Do not wear jewelry, make-up, lotions, powders or perfumes, deodorant              NO NAIL POLISH ON YOUR FINGERNAILS AND NO SHAVING 48 HOURS PRIOR TO SURGERY.     Do not bring valuables to the hospital. [facility].  Contacts, dentures or bridgework may not be worn into surgery.      Patients discharged the day of surgery will not be allowed to drive home. IF YOU ARE HAVING SURGERY AND GOING HOME THE SAME DAY, YOU MUST HAVE AN ADULT TO DRIVE YOU HOME AND BE WITH YOU FOR 24 HOURS. YOU MAY GO HOME BY TAXI OR UBER OR ORTHERWISE, BUT AN ADULT MUST ACCOMPANY YOU HOME AND STAY WITH YOU FOR 24 HOURS.  Name and phone number of your driver:  Special Instructions: N/A              Please read over the following fact sheets you were given: _____________________________________________________________________             [facility] - Preparing for Surgery Before surgery, you can play an important role.  Because skin is not sterile, your skin needs to be as free of germs as possible.  You can reduce the number of germs on your skin by washing with CHG (chlorahexidine gluconate) soap before surgery.  CHG is an antiseptic cleaner which kills germs and bonds with the skin to continue killing germs even after washing. Please DO NOT use if you have an allergy to CHG or antibacterial soaps.  If your skin becomes reddened/irritated stop using the CHG and inform your nurse when you arrive at Short Stay. Do not shave (including legs and underarms) for at least 48 hours prior to the first CHG shower.  You may shave  your face/neck. Please follow these instructions carefully:  1.  Shower with CHG Soap the night before surgery and the  morning of Surgery.  2.  If you choose to wash your hair, wash your hair first as usual with your  normal  shampoo.  3.  After you shampoo, rinse your hair and body thoroughly to remove the  shampoo.                           4.  Use CHG as you would any other liquid soap.  You can apply chg directly  to the skin and wash                       Gently with a scrungie or clean washcloth.  5.  Apply the CHG Soap to your body ONLY FROM THE NECK DOWN.   Do not use on face/ open                           Wound or open sores. Avoid contact with eyes, ears mouth and genitals (private parts).                       Wash face,  Genitals (private parts) with your normal soap.             6.  Wash thoroughly, paying special attention to the area where your surgery  will be performed.  7.  Thoroughly rinse your body with warm water from the neck down.  8.  DO NOT shower/wash with your normal soap after using and rinsing off  the CHG Soap.                9.  Pat yourself dry with a clean towel.            10.  Wear clean pajamas.            11.  Place clean sheets on your bed the night of your first shower and do not  sleep with pets. Day of Surgery : Do not apply any lotions/deodorants the morning of surgery.  Please wear clean clothes to the hospital/surgery center.  FAILURE TO FOLLOW THESE INSTRUCTIONS MAY RESULT IN THE CANCELLATION OF YOUR SURGERY PATIENT SIGNATURE_________________________________  NURSE SIGNATURE__________________________________  ________________________________________________________________________   Cheryl Hendricks  An incentive spirometer is a tool that can help keep your lungs clear and active. This tool measures how well you are filling your lungs with each breath. Taking long deep breaths may help reverse or decrease the chance of developing  breathing (pulmonary) problems (especially infection) following:  A long period of time when you are unable to move or be active. BEFORE THE PROCEDURE   If the spirometer includes an indicator to show your best effort, your nurse or respiratory therapist will set it to a desired goal.  If possible, sit up straight or lean slightly forward. Try not to slouch.  Hold the incentive spirometer in an upright position. INSTRUCTIONS FOR USE  1. Sit on the edge of your bed if possible, or sit up as far as you can in bed or on a chair. 2. Hold the incentive spirometer in an upright position. 3. Breathe out normally. 4. Place the mouthpiece in your mouth and seal your lips tightly around it. 5. Breathe in slowly and as deeply as possible, raising the piston or the ball toward the top of the column. 6. Hold your breath for 3-5 seconds or for as long as possible. Allow the piston or ball to fall to the bottom of the column. 7. Remove the mouthpiece from your mouth and breathe out normally. 8. Rest for a few seconds and repeat Steps 1 through 7 at least 10 times every 1-2 hours when you are awake. Take your time and take a few normal breaths between deep breaths. 9. The spirometer may include an indicator to show your best effort. Use the indicator as a goal to work toward during each repetition. 10. After each set of 10 deep breaths, practice coughing to be sure your lungs are clear. If you have an incision (the cut made at the time of surgery), support your incision when coughing by placing a  pillow or rolled up towels firmly against it. Once you are able to get out of bed, walk around indoors and cough well. You may stop using the incentive spirometer when instructed by your caregiver.  RISKS AND COMPLICATIONS  Take your time so you do not get dizzy or light-headed.  If you are in pain, you may need to take or ask for pain medication before doing incentive spirometry. It is harder to take a deep  breath if you are having pain. AFTER USE  Rest and breathe slowly and easily.  It can be helpful to keep track of a log of your progress. Your caregiver can provide you with a simple table to help with this. If you are using the spirometer at home, follow these instructions: [place] IF:   You are having difficultly using the spirometer.  You have trouble using the spirometer as often as instructed.  Your pain medication is not giving enough relief while using the spirometer.  You develop fever of 100.5 F (38.1 C) or higher. SEEK IMMEDIATE MEDICAL CARE IF:   You cough up bloody sputum that had not been present before.  You develop fever of 102 F (38.9 C) or greater.  You develop worsening pain at or near the incision site. MAKE SURE YOU:   Understand these instructions.  Will watch your condition.  Will get help right away if you are not doing well or get worse. Document Released: 05/10/2006 Document Revised: 03/22/2011 Document Reviewed: 07/11/2006 [facility] Patient Information 2014 [place], [place].   ________________________________________________________________________

## 2020-01-31 NOTE — Progress Notes (Signed)
Please place orders in epic pt. Is scheduled for preop 

## 2020-02-04 ENCOUNTER — Encounter (HOSPITAL_COMMUNITY): Payer: Self-pay

## 2020-02-04 ENCOUNTER — Other Ambulatory Visit: Payer: Self-pay

## 2020-02-04 ENCOUNTER — Telehealth: Payer: Self-pay

## 2020-02-04 ENCOUNTER — Encounter (HOSPITAL_COMMUNITY)
Admission: RE | Admit: 2020-02-04 | Discharge: 2020-02-04 | Disposition: A | Discharge status: Home / Self Care | Payer: Medicare Other | Source: Ambulatory Visit | Attending: Orthopaedic Surgery | Admitting: Orthopaedic Surgery

## 2020-02-04 DIAGNOSIS — Z01818 Encounter for other preprocedural examination: Secondary | ICD-10-CM | POA: Diagnosis not present

## 2020-02-04 HISTORY — DX: Gastro-esophageal reflux disease without esophagitis: K21.9

## 2020-02-04 HISTORY — DX: Other complications of anesthesia, initial encounter: T88.59XA

## 2020-02-04 HISTORY — DX: Personal history of other diseases of the digestive system: Z87.19

## 2020-02-04 HISTORY — DX: Unspecified ovarian cyst, right side: N83.201

## 2020-02-04 HISTORY — DX: Unspecified osteoarthritis, unspecified site: M19.90

## 2020-02-04 LAB — TYPE AND SCREEN
ABO/RH(D): B POS
Antibody Screen: NEGATIVE

## 2020-02-04 LAB — BASIC METABOLIC PANEL
Anion gap: 13 (ref 5–15)
BUN: 20 mg/dL (ref 8–23)
CO2: 26 mmol/L (ref 22–32)
Calcium: 9.8 mg/dL (ref 8.9–10.3)
Chloride: 100 mmol/L (ref 98–111)
Creatinine, Ser: 0.67 mg/dL (ref 0.44–1.00)
GFR, Estimated: >60 mL/min (ref >60)
Glucose, Bld: 117 mg/dL — ABNORMAL HIGH (ref 70–99)
Potassium: 3.7 mmol/L (ref 3.5–5.1)
Sodium: 139 mmol/L (ref 135–145)

## 2020-02-04 LAB — CBC
HCT: 46.6 % — ABNORMAL HIGH (ref 36.0–46.0)
Hemoglobin: 14.4 g/dL (ref 12.0–15.0)
MCH: 28.2 pg (ref 26.0–34.0)
MCHC: 30.9 g/dL (ref 30.0–36.0)
MCV: 91.4 fL (ref 80.0–100.0)
Platelets: 348 10*3/uL (ref 150–400)
RBC: 5.1 MIL/uL (ref 3.87–5.11)
RDW: 13 % (ref 11.5–15.5)
WBC: 7.5 10*3/uL (ref 4.0–10.5)
nRBC: 0 % (ref 0.0–0.2)

## 2020-02-04 LAB — HEMOGLOBIN A1C
Hgb A1c MFr Bld: 6.4 % — ABNORMAL HIGH (ref 4.8–5.6)
Mean Plasma Glucose: 136.98 mg/dL

## 2020-02-04 LAB — GLUCOSE, CAPILLARY: Glucose-Capillary: 118 mg/dL — ABNORMAL HIGH (ref 70–99)

## 2020-02-04 LAB — SURGICAL PCR SCREEN
MRSA, PCR: NEGATIVE
Staphylococcus aureus: POSITIVE — AB

## 2020-02-04 NOTE — Telephone Encounter (Signed)
Pt called asking if she can proceed tomorrow with her 2nd and final shingles shot.  Pt surgery is feb 4th she just wanted to double check.

## 2020-02-04 NOTE — Telephone Encounter (Signed)
Lvm informing pt.

## 2020-02-04 NOTE — Telephone Encounter (Signed)
It will be fine for her to have her shingles vaccination.

## 2020-02-05 DIAGNOSIS — I10 Essential (primary) hypertension: Secondary | ICD-10-CM | POA: Diagnosis not present

## 2020-02-05 DIAGNOSIS — N9489 Other specified conditions associated with female genital organs and menstrual cycle: Secondary | ICD-10-CM | POA: Diagnosis not present

## 2020-02-05 DIAGNOSIS — M169 Osteoarthritis of hip, unspecified: Secondary | ICD-10-CM | POA: Diagnosis not present

## 2020-02-05 DIAGNOSIS — E1169 Type 2 diabetes mellitus with other specified complication: Secondary | ICD-10-CM | POA: Diagnosis not present

## 2020-02-12 ENCOUNTER — Other Ambulatory Visit (HOSPITAL_COMMUNITY)
Admission: RE | Admit: 2020-02-12 | Discharge: 2020-02-12 | Disposition: A | Discharge status: Home / Self Care | Payer: Medicare Other | Source: Ambulatory Visit | Attending: Orthopaedic Surgery | Admitting: Orthopaedic Surgery

## 2020-02-12 DIAGNOSIS — Z01812 Encounter for preprocedural laboratory examination: Secondary | ICD-10-CM | POA: Insufficient documentation

## 2020-02-12 DIAGNOSIS — Z20822 Contact with and (suspected) exposure to covid-19: Secondary | ICD-10-CM | POA: Diagnosis not present

## 2020-02-12 LAB — SARS CORONAVIRUS 2 (TAT 6-24 HRS): SARS Coronavirus 2: NEGATIVE

## 2020-02-14 ENCOUNTER — Telehealth: Payer: Self-pay | Admitting: *Deleted

## 2020-02-14 ENCOUNTER — Inpatient Hospital Stay: Payer: Medicare Other | Admitting: Orthopaedic Surgery

## 2020-02-14 ENCOUNTER — Other Ambulatory Visit: Payer: Self-pay | Admitting: Orthopaedic Surgery

## 2020-02-14 MED ORDER — ASPIRIN 325 MG PO TABS: 325.0000 mg | ORAL_TABLET | Freq: Two times a day (BID) | ORAL | 0 refills | Status: DC

## 2020-02-14 MED ORDER — OXYCODONE HCL 5 MG PO TABS: 5.0000 mg | ORAL_TABLET | Freq: Four times a day (QID) | ORAL | 0 refills | Status: AC | PRN

## 2020-02-14 MED ORDER — METHOCARBAMOL 500 MG PO TABS: 500.0000 mg | ORAL_TABLET | Freq: Four times a day (QID) | ORAL | 1 refills | Status: AC | PRN

## 2020-02-14 NOTE — H&P (Signed)
TOTAL HIP ADMISSION H&P  Patient is admitted for right total hip arthroplasty.  Subjective:  Chief Complaint: right hip pain  HPI: Cheryl Hendricks, 70 y.o. female, has a history of pain and functional disability in the right hip(s) due to arthritis and patient has failed non-surgical conservative treatments for greater than 12 weeks to include NSAID's and/or analgesics, corticosteriod injections, flexibility and strengthening excercises and activity modification.  Onset of symptoms was gradual starting 3 years ago with gradually worsening course since that time.The patient noted no past surgery on the right hip(s).  Patient currently rates pain in the right hip at 10 out of 10 with activity. Patient has night pain, worsening of pain with activity and weight bearing, trendelenberg gait, pain that interfers with activities of daily living and pain with passive range of motion. Patient has evidence of periarticular osteophytes, joint space narrowing and labral tearing by imaging studies. This condition presents safety issues increasing the risk of falls.  There is no current active infection.  Patient Active Problem List   Diagnosis Date Noted  . History of cervical dysplasia 09/19/2019  . Adnexal mass 09/19/2019  . Unilateral primary osteoarthritis, right hip 08/15/2019   Past Medical History:  Diagnosis Date  . Adnexal mass   . Arthritis   . Colon polyps   . Complication of anesthesia    waking up during ablation  . Cyst of right ovary   . Diabetes mellitus without complication ([place])    type 2  . GERD (gastroesophageal reflux disease)   . History of cervical dysplasia   . History of hiatal hernia   . HLD (hyperlipidemia)   . HTN (hypertension)   . Osteopenia   . PVCs (premature ventricular contractions)    s/p cardiac ablation in 2011    Past Surgical History:  Procedure Laterality Date  . APPENDECTOMY     at same time as her left ovarian cyst removal  . CARDIAC ELECTROPHYSIOLOGY  STUDY AND ABLATION    . OVARIAN CYST REMOVAL  1973  . TONSILLECTOMY    . TUBAL LIGATION  1996    No current facility-administered medications for this encounter.   Current Outpatient Medications  Medication Sig Dispense Refill Last Dose  . Alpha-Lipoic Acid 600 MG TABS Take 600 mg by mouth daily.     . Ascorbic Acid (VITAMIN C) 1000 MG tablet Take 1,000 mg by mouth daily.     Marland Kitchen atorvastatin (LIPITOR) 40 MG tablet Take 40 mg by mouth daily.     . BENFOTIAMINE PO Take 600 mg by mouth daily.     . Calcium Carbonate (CALCIUM-CARB 600 PO) Take 1,200 mg by mouth daily.     . Cholecalciferol (VITAMIN D3) 125 MCG (5000 UT) TABS Take 5,000 Units by mouth 4 (four) times a week.     . empagliflozin (JARDIANCE) 10 MG TABS tablet Take 10 mg by mouth daily.     Marland Kitchen GLUCOSAMINE-CHONDROITIN PO Take 1 tablet by mouth in the morning and at bedtime. Triple strength 1500/1200     . hydrochlorothiazide (HYDRODIURIL) 12.5 MG tablet Take 12.5 mg by mouth daily.     Marland Kitchen losartan (COZAAR) 50 MG tablet Take 50 mg by mouth daily.     . magnesium gluconate (MAGONATE) 500 MG tablet Take 500 mg by mouth daily.     . metFORMIN (GLUCOPHAGE) 1000 MG tablet Take 1,000 mg by mouth 2 (two) times daily with a meal.     . Multiple Vitamins-Minerals (MULTIVITAMIN WOMENS 50+ ADV) TABS  Take 1 tablet by mouth daily.     . Omega-3 Fatty Acids (FISH OIL) 1200 MG CAPS Take 1,200 mg by mouth in the morning and at bedtime.     . Turmeric 500 MG TABS Take 500 mg by mouth in the morning and at bedtime.     Marland Kitchen zinc gluconate 50 MG tablet Take 50 mg by mouth daily.     Marland Kitchen aspirin 325 MG tablet Take 1 tablet (325 mg total) by mouth 2 (two) times daily after a meal. 30 tablet 0   . methocarbamol (ROBAXIN) 500 MG tablet Take 1 tablet (500 mg total) by mouth every 6 (six) hours as needed. 40 tablet 1   . oxyCODONE (ROXICODONE) 5 MG immediate release tablet Take 1-2 tablets (5-10 mg total) by mouth every 6 (six) hours as needed for severe pain. 30  tablet 0    Allergies  Allergen Reactions  . Ondansetron Hcl Anaphylaxis  . Lisinopril     Other reaction(s): Cough, cough  . Rosuvastatin Calcium Other (See Comments)    Elevated LFT's    Social History   Tobacco Use  . Smoking status: Former Smoker    Years: 10.00    Quit date: 1980    Years since quitting: 42.1  . Smokeless tobacco: Never Used  Substance Use Topics  . Alcohol use: Not Currently    Comment: 2x a year    Family History  Problem Relation Age of Onset  . Stroke Mother   . Hypertension Father   . Melanoma Brother   . Ovarian cancer Neg Hx   . Uterine cancer Neg Hx   . Breast cancer Neg Hx   . Colon cancer Neg Hx      Review of Systems  All other systems reviewed and are negative.   Objective:  Physical Exam Vitals reviewed.  Constitutional:      Appearance: Normal appearance.  HENT:     Head: Normocephalic and atraumatic.  Eyes:     Extraocular Movements: Extraocular movements intact.     Pupils: Pupils are equal, round, and reactive to light.  Cardiovascular:     Rate and Rhythm: Normal rate and regular rhythm.     Pulses: Normal pulses.  Pulmonary:     Effort: Pulmonary effort is normal.     Breath sounds: Normal breath sounds.  Abdominal:     General: Abdomen is flat.  Musculoskeletal:     Cervical back: Normal range of motion and neck supple.     Right hip: Tenderness and bony tenderness present. Decreased range of motion. Decreased strength.  Neurological:     Mental Status: She is alert and oriented to person, place, and time.  Psychiatric:        Behavior: Behavior normal.     Vital signs in last 24 hours:    Labs:   Estimated body mass index is 26.63 kg/m as calculated from the following:   Height as of 02/04/20: 5\' 7"  (1.702 m).   Weight as of 02/04/20: 77.1 kg.   Imaging Review Plain radiographs demonstrate severe degenerative joint disease of the right hip(s). The bone quality appears to be good for age and  reported activity level.      Assessment/Plan:  End stage arthritis, right hip(s)  The patient history, physical examination, clinical judgement of the provider and imaging studies are consistent with end stage degenerative joint disease of the right hip(s) and total hip arthroplasty is deemed medically necessary. The treatment options including medical  management, injection therapy, arthroscopy and arthroplasty were discussed at length. The risks and benefits of total hip arthroplasty were presented and reviewed. The risks due to aseptic loosening, infection, stiffness, dislocation/subluxation,  thromboembolic complications and other imponderables were discussed.  The patient acknowledged the explanation, agreed to proceed with the plan and consent was signed. Patient is being admitted for inpatient treatment for surgery, pain control, PT, OT, prophylactic antibiotics, VTE prophylaxis, progressive ambulation and ADL's and discharge planning.The patient is planning to be discharged home with home health services

## 2020-02-14 NOTE — Telephone Encounter (Signed)
Ortho bundle pre-op call completed. 

## 2020-02-14 NOTE — Care Plan (Signed)
RNCM call to patient to discuss her upcoming Right Total hip arthroplasty with Dr. Ninfa Linden. She is an Ortho bundle patient through [facility] and is agreeable to case management. She will also be a same day discharge. She will be going to a friend's home after surgery. She will need a FWW and 3in1/BSC. These have been ordered through [facility] to be delivered to hospital prior to discharge. Reviewed all post op care instructions. Anticipate HHPT will be needed after hospital. Referral made to [facility] after choice provided. They have already been in contact with patient. Will continue to follow for needs.

## 2020-02-15 ENCOUNTER — Ambulatory Visit (HOSPITAL_COMMUNITY): Payer: Medicare Other | Admitting: Physician Assistant

## 2020-02-15 ENCOUNTER — Encounter (HOSPITAL_COMMUNITY): Payer: Self-pay | Admitting: Orthopaedic Surgery

## 2020-02-15 ENCOUNTER — Ambulatory Visit (HOSPITAL_COMMUNITY)
Admission: RE | Admit: 2020-02-15 | Discharge: 2020-02-15 | Disposition: A | Discharge status: Home / Self Care | Payer: Medicare Other | Attending: Orthopaedic Surgery | Admitting: Orthopaedic Surgery

## 2020-02-15 ENCOUNTER — Ambulatory Visit (HOSPITAL_COMMUNITY): Payer: Medicare Other | Admitting: Certified Registered Nurse Anesthetist

## 2020-02-15 ENCOUNTER — Ambulatory Visit (HOSPITAL_COMMUNITY): Payer: Medicare Other

## 2020-02-15 ENCOUNTER — Encounter (HOSPITAL_COMMUNITY)
Admission: RE | Disposition: A | Discharge status: Home / Self Care | Payer: Self-pay | Source: Home / Self Care | Attending: Orthopaedic Surgery

## 2020-02-15 DIAGNOSIS — Z87891 Personal history of nicotine dependence: Secondary | ICD-10-CM | POA: Insufficient documentation

## 2020-02-15 DIAGNOSIS — Z96641 Presence of right artificial hip joint: Secondary | ICD-10-CM

## 2020-02-15 DIAGNOSIS — Z79899 Other long term (current) drug therapy: Secondary | ICD-10-CM | POA: Diagnosis not present

## 2020-02-15 DIAGNOSIS — Z7982 Long term (current) use of aspirin: Secondary | ICD-10-CM | POA: Insufficient documentation

## 2020-02-15 DIAGNOSIS — Z471 Aftercare following joint replacement surgery: Secondary | ICD-10-CM | POA: Diagnosis not present

## 2020-02-15 DIAGNOSIS — Z419 Encounter for procedure for purposes other than remedying health state, unspecified: Secondary | ICD-10-CM

## 2020-02-15 DIAGNOSIS — Z888 Allergy status to other drugs, medicaments and biological substances status: Secondary | ICD-10-CM | POA: Insufficient documentation

## 2020-02-15 DIAGNOSIS — E785 Hyperlipidemia, unspecified: Secondary | ICD-10-CM | POA: Diagnosis not present

## 2020-02-15 DIAGNOSIS — Z7984 Long term (current) use of oral hypoglycemic drugs: Secondary | ICD-10-CM | POA: Diagnosis not present

## 2020-02-15 DIAGNOSIS — E119 Type 2 diabetes mellitus without complications: Secondary | ICD-10-CM | POA: Diagnosis not present

## 2020-02-15 DIAGNOSIS — M1611 Unilateral primary osteoarthritis, right hip: Secondary | ICD-10-CM | POA: Diagnosis not present

## 2020-02-15 HISTORY — PX: TOTAL HIP ARTHROPLASTY: SHX124

## 2020-02-15 LAB — GLUCOSE, CAPILLARY
Glucose-Capillary: 113 mg/dL — ABNORMAL HIGH (ref 70–99)
Glucose-Capillary: 108 mg/dL — ABNORMAL HIGH (ref 70–99)

## 2020-02-15 LAB — ABO/RH: ABO/RH(D): B POS

## 2020-02-15 SURGERY — ARTHROPLASTY, HIP, TOTAL, ANTERIOR APPROACH
Anesthesia: Spinal | Site: Hip | Laterality: Right

## 2020-02-15 MED ORDER — LACTATED RINGERS IV BOLUS
500.0000 mL | Freq: Once | INTRAVENOUS | Status: AC
  Administered 2020-02-15: 500 mL via INTRAVENOUS

## 2020-02-15 MED ORDER — METHOCARBAMOL 500 MG IVPB - SIMPLE MED
INTRAVENOUS | Status: AC
  Filled 2020-02-15: qty 50

## 2020-02-15 MED ORDER — PROPOFOL 500 MG/50ML IV EMUL
INTRAVENOUS | Status: DC | PRN
  Administered 2020-02-15: 75 ug/kg/min via INTRAVENOUS

## 2020-02-15 MED ORDER — MEPERIDINE HCL 50 MG/ML IJ SOLN: 6.2500 mg | INTRAMUSCULAR | Status: DC | PRN

## 2020-02-15 MED ORDER — MIDAZOLAM HCL 5 MG/5ML IJ SOLN
INTRAMUSCULAR | Status: DC | PRN
  Administered 2020-02-15: 2 mg via INTRAVENOUS

## 2020-02-15 MED ORDER — FENTANYL CITRATE (PF) 100 MCG/2ML IJ SOLN
INTRAMUSCULAR | Status: DC | PRN
  Administered 2020-02-15: 100 ug via INTRAVENOUS

## 2020-02-15 MED ORDER — CHLORHEXIDINE GLUCONATE 0.12 % MT SOLN: 15.0000 mL | Freq: Once | OROMUCOSAL | Status: AC

## 2020-02-15 MED ORDER — PROPOFOL 10 MG/ML IV BOLUS
INTRAVENOUS | Status: DC | PRN
  Administered 2020-02-15: 10 mg via INTRAVENOUS

## 2020-02-15 MED ORDER — LACTATED RINGERS IV BOLUS
250.0000 mL | Freq: Once | INTRAVENOUS | Status: AC
  Administered 2020-02-15: 250 mL via INTRAVENOUS

## 2020-02-15 MED ORDER — TRANEXAMIC ACID-NACL 1000-0.7 MG/100ML-% IV SOLN
INTRAVENOUS | Status: DC | PRN
  Administered 2020-02-15: 1000 mg via INTRAVENOUS

## 2020-02-15 MED ORDER — PROMETHAZINE HCL 25 MG/ML IJ SOLN
6.2500 mg | INTRAMUSCULAR | Status: DC | PRN
  Administered 2020-02-15: 12.5 mg via INTRAVENOUS

## 2020-02-15 MED ORDER — CEFAZOLIN SODIUM-DEXTROSE 1-4 GM/50ML-% IV SOLN: 1.0000 g | Freq: Once | INTRAVENOUS | Status: DC

## 2020-02-15 MED ORDER — STERILE WATER FOR IRRIGATION IR SOLN
Status: DC | PRN
  Administered 2020-02-15: 2000 mL

## 2020-02-15 MED ORDER — ACETAMINOPHEN 500 MG PO TABS
ORAL_TABLET | ORAL | Status: AC
  Filled 2020-02-15: qty 1

## 2020-02-15 MED ORDER — DEXAMETHASONE SODIUM PHOSPHATE 10 MG/ML IJ SOLN
INTRAMUSCULAR | Status: DC | PRN
  Administered 2020-02-15: 8 mg via INTRAVENOUS

## 2020-02-15 MED ORDER — ACETAMINOPHEN 160 MG/5ML PO SOLN: 325.0000 mg | ORAL | Status: DC | PRN

## 2020-02-15 MED ORDER — FENTANYL CITRATE (PF) 100 MCG/2ML IJ SOLN
INTRAMUSCULAR | Status: AC
  Filled 2020-02-15: qty 2

## 2020-02-15 MED ORDER — ORAL CARE MOUTH RINSE
15.0000 mL | Freq: Once | OROMUCOSAL | Status: AC
  Administered 2020-02-15: 15 mL via OROMUCOSAL

## 2020-02-15 MED ORDER — ONDANSETRON HCL 4 MG/2ML IJ SOLN
INTRAMUSCULAR | Status: AC
  Filled 2020-02-15: qty 2

## 2020-02-15 MED ORDER — SODIUM CHLORIDE 0.9 % IR SOLN
Status: DC | PRN
  Administered 2020-02-15: 1000 mL

## 2020-02-15 MED ORDER — 0.9 % SODIUM CHLORIDE (POUR BTL) OPTIME
TOPICAL | Status: DC | PRN
  Administered 2020-02-15: 1000 mL

## 2020-02-15 MED ORDER — LACTATED RINGERS IV SOLN: INTRAVENOUS | Status: DC

## 2020-02-15 MED ORDER — TRANEXAMIC ACID-NACL 1000-0.7 MG/100ML-% IV SOLN
INTRAVENOUS | Status: AC
  Filled 2020-02-15: qty 100

## 2020-02-15 MED ORDER — OXYCODONE HCL 5 MG PO TABS
5.0000 mg | ORAL_TABLET | Freq: Once | ORAL | Status: AC | PRN
  Administered 2020-02-15: 5 mg via ORAL

## 2020-02-15 MED ORDER — METHOCARBAMOL 500 MG PO TABS: 500.0000 mg | ORAL_TABLET | Freq: Four times a day (QID) | ORAL | Status: DC | PRN

## 2020-02-15 MED ORDER — KETOROLAC TROMETHAMINE 15 MG/ML IJ SOLN
INTRAMUSCULAR | Status: AC
  Filled 2020-02-15: qty 1

## 2020-02-15 MED ORDER — OXYCODONE HCL 5 MG PO TABS
ORAL_TABLET | ORAL | Status: AC
  Filled 2020-02-15: qty 1

## 2020-02-15 MED ORDER — KETOROLAC TROMETHAMINE 15 MG/ML IJ SOLN
15.0000 mg | Freq: Once | INTRAMUSCULAR | Status: AC
  Administered 2020-02-15: 15 mg via INTRAVENOUS

## 2020-02-15 MED ORDER — ACETAMINOPHEN 325 MG PO TABS
325.0000 mg | ORAL_TABLET | ORAL | Status: DC | PRN
  Administered 2020-02-15: 500 mg via ORAL

## 2020-02-15 MED ORDER — PROPOFOL 10 MG/ML IV BOLUS
INTRAVENOUS | Status: AC
  Filled 2020-02-15: qty 40

## 2020-02-15 MED ORDER — MEPIVACAINE HCL (PF) 2 % IJ SOLN
INTRAMUSCULAR | Status: DC | PRN
  Administered 2020-02-15: 3 mL via INTRATHECAL

## 2020-02-15 MED ORDER — MIDAZOLAM HCL 2 MG/2ML IJ SOLN
INTRAMUSCULAR | Status: AC
  Filled 2020-02-15: qty 2

## 2020-02-15 MED ORDER — METHOCARBAMOL 500 MG IVPB - SIMPLE MED
500.0000 mg | Freq: Four times a day (QID) | INTRAVENOUS | Status: DC | PRN
  Administered 2020-02-15: 500 mg via INTRAVENOUS

## 2020-02-15 MED ORDER — PROMETHAZINE HCL 25 MG/ML IJ SOLN
INTRAMUSCULAR | Status: AC
  Filled 2020-02-15: qty 1

## 2020-02-15 MED ORDER — POVIDONE-IODINE 10 % EX SWAB
2.0000 "application " | Freq: Once | CUTANEOUS | Status: AC
  Administered 2020-02-15: 2 via TOPICAL

## 2020-02-15 MED ORDER — OXYCODONE HCL 5 MG/5ML PO SOLN: 5.0000 mg | Freq: Once | ORAL | Status: AC | PRN

## 2020-02-15 MED ORDER — CEFAZOLIN SODIUM-DEXTROSE 2-4 GM/100ML-% IV SOLN
2.0000 g | INTRAVENOUS | Status: AC
  Administered 2020-02-15: 2 g via INTRAVENOUS
  Filled 2020-02-15: qty 100

## 2020-02-15 MED ORDER — DEXAMETHASONE SODIUM PHOSPHATE 10 MG/ML IJ SOLN
INTRAMUSCULAR | Status: AC
  Filled 2020-02-15: qty 1

## 2020-02-15 MED ORDER — FENTANYL CITRATE (PF) 100 MCG/2ML IJ SOLN: 25.0000 ug | INTRAMUSCULAR | Status: DC | PRN

## 2020-02-15 SURGICAL SUPPLY — 38 items
BAG ZIPLOCK 12X15 (MISCELLANEOUS) IMPLANT
BENZOIN TINCTURE PRP APPL 2/3 (GAUZE/BANDAGES/DRESSINGS) IMPLANT
BLADE SAW SGTL 18X1.27X75 (BLADE) ×2 IMPLANT
COVER PERINEAL POST (MISCELLANEOUS) ×2 IMPLANT
COVER SURGICAL LIGHT HANDLE (MISCELLANEOUS) ×2 IMPLANT
COVER WAND RF STERILE (DRAPES) ×2 IMPLANT
CUP SECTOR GRIPTON 50MM (Cup) ×2 IMPLANT
DRAPE STERI IOBAN 125X83 (DRAPES) ×2 IMPLANT
DRAPE U-SHAPE 47X51 STRL (DRAPES) ×4 IMPLANT
DRSG AQUACEL AG ADV 3.5X10 (GAUZE/BANDAGES/DRESSINGS) ×2 IMPLANT
DURAPREP 26ML APPLICATOR (WOUND CARE) ×2 IMPLANT
ELECT REM PT RETURN 15FT ADLT (MISCELLANEOUS) ×2 IMPLANT
GAUZE XEROFORM 1X8 LF (GAUZE/BANDAGES/DRESSINGS) ×2 IMPLANT
GLOVE BIO SURGEON STRL SZ7.5 (GLOVE) ×2 IMPLANT
GLOVE ECLIPSE 8.0 STRL XLNG CF (GLOVE) ×2 IMPLANT
GLOVE SRG 8 PF TXTR STRL LF DI (GLOVE) ×2 IMPLANT
GLOVE SURG UNDER POLY LF SZ8 (GLOVE) ×2
GOWN STRL REUS W/TWL XL LVL3 (GOWN DISPOSABLE) ×4 IMPLANT
HANDPIECE INTERPULSE COAX TIP (DISPOSABLE) ×1
HEAD FEM STD 32X+1 STRL (Hips) ×2 IMPLANT
HOLDER FOLEY CATH W/STRAP (MISCELLANEOUS) ×2 IMPLANT
KIT TURNOVER KIT A (KITS) ×2 IMPLANT
LINER ACETABULAR 32X50 (Liner) ×2 IMPLANT
PACK ANTERIOR HIP CUSTOM (KITS) ×2 IMPLANT
PENCIL SMOKE EVACUATOR (MISCELLANEOUS) ×2 IMPLANT
SCREW 6.5MMX25MM (Screw) ×2 IMPLANT
SET HNDPC FAN SPRY TIP SCT (DISPOSABLE) ×1 IMPLANT
STAPLER VISISTAT 35W (STAPLE) ×2 IMPLANT
STEM CORAIL KA12 (Stem) ×2 IMPLANT
STRIP CLOSURE SKIN 1/2X4 (GAUZE/BANDAGES/DRESSINGS) IMPLANT
SUT ETHIBOND NAB CT1 #1 30IN (SUTURE) ×2 IMPLANT
SUT ETHILON 2 0 PS N (SUTURE) IMPLANT
SUT MNCRL AB 4-0 PS2 18 (SUTURE) IMPLANT
SUT VIC AB 0 CT1 36 (SUTURE) ×2 IMPLANT
SUT VIC AB 1 CT1 36 (SUTURE) ×2 IMPLANT
SUT VIC AB 2-0 CT1 27 (SUTURE) ×2
SUT VIC AB 2-0 CT1 TAPERPNT 27 (SUTURE) ×2 IMPLANT
TRAY FOLEY SLVR 14FR TEMP STAT (SET/KITS/TRAYS/PACK) ×2 IMPLANT

## 2020-02-15 NOTE — Anesthesia Procedure Notes (Signed)
Spinal  Patient location during procedure: OR Start time: 02/15/2020 8:34 AM End time: 02/15/2020 8:37 AM Staffing Performed: resident/CRNA  Anesthesiologist: Lyn Hollingshead, MD Resident/CRNA: West Pugh, CRNA Preanesthetic Checklist Completed: patient identified, IV checked, site marked, risks and benefits discussed, surgical consent, monitors and equipment checked, pre-op evaluation and timeout performed Spinal Block Patient position: sitting Prep: DuraPrep and site prepped and draped Patient monitoring: heart rate, continuous pulse ox and blood pressure Approach: midline Location: L3-4 Injection technique: single-shot Needle Needle type: Pencan and Introducer  Needle gauge: 24 G Needle length: 10 cm Assessment Sensory level: T4 Additional Notes IV functioning, monitors applied to pt. Expiration date of kit checked and confirmed to be in date. Sterile prep and drape, hand hygiene and sterile gloved used. Pt was positioned and spine was prepped in sterile fashion. Skin was anesthetized with lidocaine. Free flow of clear CSF obtained prior to injecting local anesthetic into CSF x 1 attempt. Spinal needle aspirated freely following injection. Needle was carefully withdrawn, and pt tolerated procedure well. Loss of motor and sensory on exam post injection. Dr Jillyn Hidden present for entire procedure.

## 2020-02-15 NOTE — Discharge Instructions (Signed)

## 2020-02-15 NOTE — Anesthesia Postprocedure Evaluation (Signed)
Anesthesia Post Note  Patient: Cheryl Hendricks  Procedure(s) Performed: RIGHT TOTAL HIP ARTHROPLASTY ANTERIOR APPROACH (Right Hip)     Patient location during evaluation: Phase II Anesthesia Type: Spinal Level of consciousness: awake Pain management: pain level controlled Vital Signs Assessment: post-procedure vital signs reviewed and stable Cardiovascular status: stable Postop Assessment: no headache, no backache, spinal receding, patient able to bend at knees and no apparent nausea or vomiting Anesthetic complications: no   No complications documented.  Last Vitals:  Vitals:   02/15/20 1103 02/15/20 1121  BP: 134/80 (!) 155/88  Pulse: (!) 57 (!) 57  Resp: 14 18  Temp: (!) 36.3 C (!) 36.4 C  SpO2: 100%     Last Pain:  Vitals:   02/15/20 1121  TempSrc: Oral  PainSc: 5                  [employer]

## 2020-02-15 NOTE — Care Plan (Signed)
Ortho Bundle Case Management Note  Patient Details  Name: Cheryl Hendricks MRN: [id number] Date of Birth: 1950-09-17  [facility] call to patient to discuss her upcoming Right Total hip arthroplasty with Dr. Ninfa Linden. She is an Ortho bundle patient through [facility] and is agreeable to case management. She will also be a same day discharge. She will be going to a friend's home after surgery. She will need a FWW and 3in1/BSC. These have been ordered through [facility] to be delivered to hospital prior to discharge. Reviewed all post op care instructions. Anticipate HHPT will be needed after hospital. Referral made to [facility] after choice provided. They have already been in contact with patient. Will continue to follow for needs.                DME Arranged:  3-N-1,[place] rolling DME Agency:  [employer]  HH Arranged:  PT [place] Agency:  [facility] (now [facility])  Additional Comments: Please contact me with any questions of if this plan should need to change.  Jamse Arn, RN, BSN, [employer]  [phone] 02/15/2020, 12:03 PM

## 2020-02-15 NOTE — Brief Op Note (Signed)
02/15/2020  9:59 AM  PATIENT:  Cheryl Hendricks  70 y.o. female  PRE-OPERATIVE DIAGNOSIS:  osteoarthritis right hip  POST-OPERATIVE DIAGNOSIS:  osteoarthritis right hip  PROCEDURE:  Procedure(s): RIGHT TOTAL HIP ARTHROPLASTY ANTERIOR APPROACH (Right)  SURGEON:  Surgeon(s) and Role:    Mcarthur Rossetti, MD - Primary  PHYSICIAN ASSISTANT: Benita Stabile, PA-C   ANESTHESIA:   spinal  EBL:  150 mL   COUNTS:  YES  DICTATION: .Other Dictation: Dictation Number [id number]  PLAN OF CARE: Discharge to home after PACU  PATIENT DISPOSITION:  PACU - hemodynamically stable.   Delay start of Pharmacological VTE agent (>24hrs) due to surgical blood loss or risk of bleeding: no

## 2020-02-15 NOTE — Evaluation (Signed)
Physical Therapy Evaluation Patient Details Name: Cheryl Hendricks MRN: [id number] DOB: 08/03/1950 Today's Date: 02/15/2020   History of Present Illness  Pt s/p R THR and with hx of DM`  Clinical Impression  Pt s/p R THR and presents with functional mobility limitations 2* decreased R LE strength/ROM and post op pain.  Pt should progress to dc home with HHPT and 24/7 assist of friend.  Pt mobilizing at min guard assist but limited this session by onset dizziness/fatigue.      Follow Up Recommendations Home health PT    Equipment Recommendations  Rolling walker with 5" wheels;3in1 (PT)    Recommendations for Other Services       Precautions / Restrictions Precautions Precautions: Fall Restrictions Weight Bearing Restrictions: No      Mobility  Bed Mobility Overal bed mobility: Needs Assistance Bed Mobility: Rolling;Sidelying to Sit Rolling: Min guard Sidelying to sit: Min guard       General bed mobility comments: cues for log roll technique to move to side for side sleeping; min guard for LE control moving side ly to sit    Transfers Overall transfer level: Needs assistance Equipment used: Rolling walker (2 wheeled) Transfers: Sit to/from Stand Sit to Stand: Min assist;Min guard         General transfer comment: cues for LE management and use of UEs to self assist  Ambulation/Gait Ambulation/Gait assistance: Min assist;Min guard Gait Distance (Feet): 85 Feet Assistive device: Rolling walker (2 wheeled) Gait Pattern/deviations: Step-to pattern;Decreased step length - right;Decreased step length - left;Shuffle;Trunk flexed Gait velocity: decr   General Gait Details: cues for sequence, posture and position from RW; distance ltd by onset dizziness/fatigue  Stairs            Wheelchair Mobility    Modified Rankin (Stroke Patients Only)       Balance Overall balance assessment: Mild deficits observed, not formally tested                                            Pertinent Vitals/Pain Pain Assessment: 0-10 Pain Score: 6  Pain Location: R hip Pain Descriptors / Indicators: Aching;Sore Pain Intervention(s): Limited activity within patient's tolerance;Monitored during session;Premedicated before session;Ice applied    Home Living Family/patient expects to be discharged to:: Private residence Living Arrangements: Alone Available Help at Discharge: Available 24 hours/day;Friend(s) Type of Home: House Home Access: Stairs to enter Entrance Stairs-Rails: None Entrance Stairs-Number of Steps: 2 Home Layout: One level Home Equipment: None Additional Comments: Pt will stay with friend - home above refers to friends house    Prior Function Level of Independence: Independent               Hand Dominance        Extremity/Trunk Assessment   Upper Extremity Assessment Upper Extremity Assessment: Overall WFL for tasks assessed    Lower Extremity Assessment Lower Extremity Assessment: RLE deficits/detail    Cervical / Trunk Assessment Cervical / Trunk Assessment: Normal  Communication   Communication: No difficulties  Cognition Arousal/Alertness: Awake/alert Behavior During Therapy: WFL for tasks assessed/performed Overall Cognitive Status: Within Functional Limits for tasks assessed                                        General Comments  Exercises     Assessment/Plan    PT Assessment Patient needs continued PT services  PT Problem List Decreased strength;Decreased range of motion;Decreased activity tolerance;Decreased balance;Decreased mobility;Decreased knowledge of use of DME;Pain       PT Treatment Interventions DME instruction;Gait training;Stair training;Functional mobility training;Therapeutic activities;Therapeutic exercise;Balance training;Patient/family education    PT Goals (Current goals can be found in the Care Plan section)  Acute Rehab PT Goals Patient Stated  Goal: Regain IND ASAP PT Goal Formulation: With patient Time For Goal Achievement: 02/22/20 Potential to Achieve Goals: Good    Frequency 7X/week   Barriers to discharge        Co-evaluation               AM-PAC PT "6 Clicks" Mobility  Outcome Measure Help needed turning from your back to your side while in a flat bed without using bedrails?: A Little Help needed moving from lying on your back to sitting on the side of a flat bed without using bedrails?: A Little Help needed moving to and from a bed to a chair (including a wheelchair)?: A Little Help needed standing up from a chair using your arms (e.g., wheelchair or bedside chair)?: A Little Help needed to walk in hospital room?: A Little Help needed climbing 3-5 steps with a railing? : A Little 6 Click Score: 18    End of Session Equipment Utilized During Treatment: Gait belt Activity Tolerance: Patient tolerated treatment well;Patient limited by fatigue Patient left: in chair;with call bell/phone within reach Nurse Communication: Mobility status PT Visit Diagnosis: Difficulty in walking, not elsewhere classified (R26.2)    Time: 8527-7824 PT Time Calculation (min) (ACUTE ONLY): 34 min   Charges:   PT Evaluation $PT Eval Low Complexity: 1 Low PT Treatments $Gait Training: 8-22 mins        Debe Coder PT Acute Rehabilitation Services Pager [phone] Office [phone]   Brieana Shimmin 02/15/2020, 3:16 PM

## 2020-02-15 NOTE — Interval H&P Note (Signed)
History and Physical Interval Note: The patient is here today for a right total hip arthroplasty to treat the pain from her right hip osteoarthritis.  There has been no interval change in her medical status.  See recent H&P.  The risks and benefits of surgery have been explained in detail and informed consent is obtained.  The right hip is been marked.  02/15/2020 7:11 AM  Jamse Belfast  has presented today for surgery, with the diagnosis of osteoarthritis right hip.  The various methods of treatment have been discussed with the patient and family. After consideration of risks, benefits and other options for treatment, the patient has consented to  Procedure(s): RIGHT TOTAL HIP ARTHROPLASTY ANTERIOR APPROACH (Right) as a surgical intervention.  The patient's history has been reviewed, patient examined, no change in status, stable for surgery.  I have reviewed the patient's chart and labs.  Questions were answered to the patient's satisfaction.     Mcarthur Rossetti

## 2020-02-15 NOTE — Anesthesia Preprocedure Evaluation (Addendum)
Anesthesia Evaluation  Patient identified by MRN, date of birth, ID band Patient awake    Reviewed: Allergy & Precautions, NPO status , Patient's Chart, lab work & pertinent test results  History of Anesthesia Complications (+) AWARENESS UNDER ANESTHESIA and history of anesthetic complications  Airway Mallampati: I       Dental no notable dental hx. (+) Teeth Intact   Pulmonary former smoker,    Pulmonary exam normal        Cardiovascular hypertension, Pt. on medications Normal cardiovascular exam     Neuro/Psych negative neurological ROS  negative psych ROS   GI/Hepatic   Endo/Other  diabetes, Well Controlled, Type 2, Oral Hypoglycemic Agents  Renal/GU   negative genitourinary   Musculoskeletal   Abdominal (+) + obese,   Peds  Hematology negative hematology ROS (+)   Anesthesia Other Findings   Reproductive/Obstetrics                            Anesthesia Physical Anesthesia Plan  ASA: II  Anesthesia Plan: Spinal   Post-op Pain Management:    Induction:   PONV Risk Score and Plan: 3 and Dexamethasone and Midazolam  Airway Management Planned: Natural Airway and Simple Face Mask  Additional Equipment: None  Intra-op Plan:   Post-operative Plan: Extubation in OR  Informed Consent: I have reviewed the patients History and Physical, chart, labs and discussed the procedure including the risks, benefits and alternatives for the proposed anesthesia with the patient or authorized representative who has indicated his/her understanding and acceptance.       Plan Discussed with:   Anesthesia Plan Comments:        Anesthesia Quick Evaluation

## 2020-02-15 NOTE — Progress Notes (Signed)
Physical Therapy Treatment Patient Details Name: Cheryl Hendricks MRN: [id number] DOB: February 07, 1950 Today's Date: 02/15/2020    History of Present Illness Pt s/p R THR and with hx of DM`    PT Comments    Pt up to mobilize without c/o dizziness.  Pt ambulated increased distance, negotiated stairs and reviewed car transfers.  Multiple questions asked and answered.  Pt eager for dc home.  Follow Up Recommendations  Home health PT     Equipment Recommendations  Rolling walker with 5" wheels;3in1 (PT)    Recommendations for Other Services       Precautions / Restrictions Precautions Precautions: Fall Restrictions Weight Bearing Restrictions: No    Mobility  Bed Mobility Overal bed mobility: Needs Assistance Bed Mobility: Rolling;Sidelying to Sit Rolling: Min guard Sidelying to sit: Min guard       General bed mobility comments: cues for log roll technique to move to side for side sleeping; min guard for LE control moving side ly to sit  Transfers Overall transfer level: Needs assistance Equipment used: Rolling walker (2 wheeled) Transfers: Sit to/from Stand Sit to Stand: Min guard;Supervision         General transfer comment: cues for LE management and use of UEs to self assist  Ambulation/Gait Ambulation/Gait assistance: Min guard;Supervision Gait Distance (Feet): 120 Feet Assistive device: Rolling walker (2 wheeled) Gait Pattern/deviations: Step-to pattern;Decreased step length - right;Decreased step length - left;Shuffle;Trunk flexed Gait velocity: decr   General Gait Details: cues for sequence, posture and position fom RW   Stairs Stairs: Yes Stairs assistance: Min assist Stair Management: One rail Right;No rails;Step to pattern;Forwards;With walker Number of Stairs: 7 General stair comments: 2 step twice with RW at top and use of door frame; 3 step with rail on L and HHA.  Cues for sequence   Wheelchair Mobility    Modified Rankin (Stroke Patients  Only)       Balance Overall balance assessment: Mild deficits observed, not formally tested                                          Cognition Arousal/Alertness: Awake/alert Behavior During Therapy: WFL for tasks assessed/performed Overall Cognitive Status: Within Functional Limits for tasks assessed                                        Exercises Total Joint Exercises Ankle Circles/Pumps: AROM;Both;15 reps;Supine    General Comments        Pertinent Vitals/Pain Pain Assessment: 0-10 Pain Score: 6  Pain Location: R hip Pain Descriptors / Indicators: Aching;Sore Pain Intervention(s): Limited activity within patient's tolerance;Monitored during session;Premedicated before session;Ice applied    Home Living Family/patient expects to be discharged to:: Private residence Living Arrangements: Alone Available Help at Discharge: Available 24 hours/day;Friend(s) Type of Home: House Home Access: Stairs to enter Entrance Stairs-Rails: None Home Layout: One level Home Equipment: None Additional Comments: Pt will stay with friend - home above refers to friends house    Prior Function Level of Independence: Independent          PT Goals (current goals can now be found in the care plan section) Acute Rehab PT Goals Patient Stated Goal: Regain IND ASAP PT Goal Formulation: With patient Time For Goal Achievement: 02/22/20 Potential to Achieve Goals: Good  Progress towards PT goals: Progressing toward goals    Frequency    7X/week      PT Plan Current plan remains appropriate    Co-evaluation              AM-PAC PT "6 Clicks" Mobility   Outcome Measure  Help needed turning from your back to your side while in a flat bed without using bedrails?: A Little Help needed moving from lying on your back to sitting on the side of a flat bed without using bedrails?: A Little Help needed moving to and from a bed to a chair (including a  wheelchair)?: A Little Help needed standing up from a chair using your arms (e.g., wheelchair or bedside chair)?: A Little Help needed to walk in hospital room?: A Little Help needed climbing 3-5 steps with a railing? : A Little 6 Click Score: 18    End of Session Equipment Utilized During Treatment: Gait belt Activity Tolerance: Patient tolerated treatment well Patient left: in chair;with call bell/phone within reach Nurse Communication: Mobility status PT Visit Diagnosis: Difficulty in walking, not elsewhere classified (R26.2)     Time: 4259-5638 PT Time Calculation (min) (ACUTE ONLY): 31 min  Charges:  $Gait Training: 8-22 mins $Therapeutic Activity: 8-22 mins                     [place] Pager [phone] Office [phone]    Jai Steil 02/15/2020, 3:24 PM

## 2020-02-15 NOTE — Op Note (Signed)
NAMETAYLEY, MUDRICK MEDICAL RECORD ZO:[id number] ACCOUNT [id number] DATE OF BIRTH:1950/11/18 FACILITY: [facility] LOCATION: [facility] PHYSICIAN:Quran Vasco [place], MD  OPERATIVE REPORT  DATE OF PROCEDURE:  02/15/2020  PREOPERATIVE DIAGNOSIS:  Primary osteoarthritis and degenerative joint disease, right hip.  POSTOPERATIVE DIAGNOSIS:  Primary osteoarthritis and degenerative joint disease, right hip.  PROCEDURE:  Right total hip arthroplasty through direct anterior approach.  IMPLANTS:  DePuy Sector Gription acetabular component size 50, size 36+0 neutral polyethylene liner, a single screw in the acetabulum, size 12 Corail femoral component with standard offset, size 32+1 hip ball.  SURGEON:  Lind Guest. Ninfa Linden, MD  ASSISTANT:  Erskine Emery, PA-C  ANESTHESIA:  Spinal.  ANTIBIOTICS:  Two grams IV Ancef.  ESTIMATED BLOOD LOSS:  Way more than in the urology case, ____ mL.  COMPLICATIONS:  None.  INDICATIONS:  The patient is a 70 year old female well known to me.  She does have debilitating arthritis involving her right hip that has been well documented with x-rays and clinical exam.  She has tried and failed steroid injections in her hip as well  as activity modification and time.  At this point, she does wish to proceed with total hip arthroplasty.  Her right hip pain is detrimentally affecting her mobility, her quality of life and activities of daily living to the point she does wish to  proceed with the surgery.  With the hip replacement surgery, she understands the risk of acute blood loss anemia, nerve or vessel injury, fracture, infection, dislocation, DVT, implant failure and skin and soft tissue issues.  She understands our goals  are to decrease pain, improve mobility and overall improve quality of life.  DESCRIPTION OF PROCEDURE:  After informed consent was obtained, appropriate right hip was marked.  She was brought to the operating room and sat up on a stretcher.   Spinal anesthesia was obtained.  She was then laid in supine position on a stretcher.   Foley catheter was placed and traction boots were placed on both her feet.  She was next placed supine on the Hana fracture table, the perineal post in place and both legs in line skeletal traction device and no traction applied.  Her right operative hip  was prepped and draped with DuraPrep and sterile drapes.  A time-out was called, and she was identified as correct patient, correct right hip.  I then made an incision just inferior and posterior to the anterior superior iliac spine and carried this  obliquely down the leg.  We dissected down to the tensor fascia lata muscle and the tensor fascia was then divided longitudinally to proceed with direct anterior approach to the hip.  We identified and cauterized circumflex vessels.  We then identified  the hip capsule, opened the hip capsule in an L-type format, finding moderate joint effusion and significant periarticular osteophytes around the lateral femoral head and neck.  We then made our femoral neck cut with an oscillating saw and completed this  with an osteotome.  We placed a corkscrew guide in the femoral head and removed the femoral head in its entirety and found a wide area devoid of cartilage.  I then placed a bent Hohmann over the medial acetabular rim and began reaming under direct  visualization after removing remnants of the acetabular labrum.  We reamed from a size 43 reamer, going up to a size 49 with again all reamers under direct visualization.  The last reamer was placed under direct fluoroscopy, so we could obtain our depth  of  reaming, our inclination and anteversion.  We then placed the real DePuy Sector Gription acetabular component size 50, with a single screw.  We placed a 32+0 neutral polyethylene liner for that size acetabular component.  Attention was then turned to  the femur.  With the leg externally rotated to 120 degrees, extended and  adducted, we were able to place Mueller retractor medially and Hohmann retractor above the greater trochanter.  We released lateral joint capsule and used a box-cutting osteotome to  enter the femoral canal and a rongeur to lateralize.  We then began broaching using the Corail broaching system going from a size 8 up to a size 12.  With the size 12 in place, we trialed a standard offset femoral neck and a 32+1 trial hip ball, reduced  this in the acetabulum and we were pleased with leg length, offset, range of motion and stability, assessed mechanically and radiographically.  We then dislocated the hip and removed the trial components.  We placed the real Corail femoral component  size 12 with standard offset and the real 32+1 hip ball.  We reduced this in the acetabulum and again we were pleased with stability.  I felt like she is just a little bit longer in leg length, but stability was more important obviously.  We then  irrigated the soft tissue with normal saline solution using pulsatile lavage.  We closed the joint capsule with interrupted #1 Ethibond suture, followed by #1 Vicryl to close the tensor fascia, 0 Vicryl was used to close the deep tissue and 2-0 Vicryl  was used to close subcutaneous tissue.  The skin was reapproximated with staples and Aquacel dressing was applied.  She was taken off the Hana table and taken to recovery room in stable condition with all final counts being correct.  No complications  noted.  Of note, Benita Stabile, PA-C did assist during the entire case.  His assistance was crucial for facilitating all aspects of this case.  HN/NUANCE  D:02/15/2020 T:02/15/2020 JOB:[id number]

## 2020-02-15 NOTE — Transfer of Care (Signed)
Immediate Anesthesia Transfer of Care Note  Patient: Cheryl Hendricks  Procedure(s) Performed: RIGHT TOTAL HIP ARTHROPLASTY ANTERIOR APPROACH (Right Hip)  Patient Location: PACU  Anesthesia Type:MAC and Spinal  Level of Consciousness: awake and patient cooperative  Airway & Oxygen Therapy: Patient Spontanous Breathing and Patient connected to face mask oxygen  Post-op Assessment: Report given to RN and Post -op Vital signs reviewed and stable  Post vital signs: Reviewed and stable  Last Vitals:  Vitals Value Taken Time  BP 107/56 02/15/20 1015  Temp    Pulse 77 02/15/20 1016  Resp 14 02/15/20 1016  SpO2 99 % 02/15/20 1016  Vitals shown include unvalidated device data.  Last Pain:  Vitals:   02/15/20 0640  TempSrc: Oral         Complications: No complications documented.

## 2020-02-15 NOTE — Anesthesia Procedure Notes (Addendum)
Procedure Name: MAC Date/Time: 02/15/2020 8:32 AM Performed by: West Pugh, CRNA Pre-anesthesia Checklist: Patient identified, Emergency Drugs available, Suction available, Patient being monitored and Timeout performed Patient Re-evaluated:Patient Re-evaluated prior to induction Oxygen Delivery Method: Simple face mask Induction Type: IV induction Placement Confirmation: positive ETCO2 Dental Injury: Teeth and Oropharynx as per pre-operative assessment

## 2020-02-16 DIAGNOSIS — Z7984 Long term (current) use of oral hypoglycemic drugs: Secondary | ICD-10-CM | POA: Diagnosis not present

## 2020-02-16 DIAGNOSIS — Z96641 Presence of right artificial hip joint: Secondary | ICD-10-CM | POA: Diagnosis not present

## 2020-02-16 DIAGNOSIS — Z471 Aftercare following joint replacement surgery: Secondary | ICD-10-CM | POA: Diagnosis not present

## 2020-02-16 DIAGNOSIS — E119 Type 2 diabetes mellitus without complications: Secondary | ICD-10-CM | POA: Diagnosis not present

## 2020-02-18 ENCOUNTER — Encounter (HOSPITAL_COMMUNITY): Payer: Self-pay | Admitting: Orthopaedic Surgery

## 2020-02-18 DIAGNOSIS — Z96641 Presence of right artificial hip joint: Secondary | ICD-10-CM | POA: Diagnosis not present

## 2020-02-18 DIAGNOSIS — E119 Type 2 diabetes mellitus without complications: Secondary | ICD-10-CM | POA: Diagnosis not present

## 2020-02-18 DIAGNOSIS — Z471 Aftercare following joint replacement surgery: Secondary | ICD-10-CM | POA: Diagnosis not present

## 2020-02-18 DIAGNOSIS — Z7984 Long term (current) use of oral hypoglycemic drugs: Secondary | ICD-10-CM | POA: Diagnosis not present

## 2020-02-20 ENCOUNTER — Telehealth: Payer: Self-pay | Admitting: *Deleted

## 2020-02-20 DIAGNOSIS — Z96641 Presence of right artificial hip joint: Secondary | ICD-10-CM | POA: Diagnosis not present

## 2020-02-20 DIAGNOSIS — E119 Type 2 diabetes mellitus without complications: Secondary | ICD-10-CM | POA: Diagnosis not present

## 2020-02-20 DIAGNOSIS — Z7984 Long term (current) use of oral hypoglycemic drugs: Secondary | ICD-10-CM | POA: Diagnosis not present

## 2020-02-20 DIAGNOSIS — Z471 Aftercare following joint replacement surgery: Secondary | ICD-10-CM | POA: Diagnosis not present

## 2020-02-20 NOTE — Telephone Encounter (Signed)
Ortho bundle d/c call completed. Slightly delayed due to CM out of office due to illness.

## 2020-02-21 DIAGNOSIS — Z471 Aftercare following joint replacement surgery: Secondary | ICD-10-CM | POA: Diagnosis not present

## 2020-02-21 DIAGNOSIS — Z96641 Presence of right artificial hip joint: Secondary | ICD-10-CM | POA: Diagnosis not present

## 2020-02-21 DIAGNOSIS — E119 Type 2 diabetes mellitus without complications: Secondary | ICD-10-CM | POA: Diagnosis not present

## 2020-02-21 DIAGNOSIS — Z7984 Long term (current) use of oral hypoglycemic drugs: Secondary | ICD-10-CM | POA: Diagnosis not present

## 2020-02-22 ENCOUNTER — Telehealth: Payer: Self-pay | Admitting: *Deleted

## 2020-02-22 NOTE — Telephone Encounter (Signed)
Ortho bundle 7 day call completed. 

## 2020-02-26 DIAGNOSIS — Z96641 Presence of right artificial hip joint: Secondary | ICD-10-CM | POA: Diagnosis not present

## 2020-02-26 DIAGNOSIS — Z7984 Long term (current) use of oral hypoglycemic drugs: Secondary | ICD-10-CM | POA: Diagnosis not present

## 2020-02-26 DIAGNOSIS — Z471 Aftercare following joint replacement surgery: Secondary | ICD-10-CM | POA: Diagnosis not present

## 2020-02-26 DIAGNOSIS — E119 Type 2 diabetes mellitus without complications: Secondary | ICD-10-CM | POA: Diagnosis not present

## 2020-02-28 ENCOUNTER — Encounter: Payer: Self-pay | Admitting: Orthopaedic Surgery

## 2020-02-28 ENCOUNTER — Ambulatory Visit (INDEPENDENT_AMBULATORY_CARE_PROVIDER_SITE_OTHER): Payer: Medicare Other | Admitting: Orthopaedic Surgery

## 2020-02-28 DIAGNOSIS — Z96641 Presence of right artificial hip joint: Secondary | ICD-10-CM

## 2020-02-28 NOTE — Progress Notes (Signed)
The patient is 2 weeks tomorrow status post a right total hip arthroplasty.  She is doing well overall and ambulating with a cane.  She is taking 325 mg aspirin twice daily.  She has been on this once daily before surgery.  She can go back to just once daily.  On exam her left leg is slightly shorter than the right operative side.  She will need a lift on that side which she already has.  Her incision looks good on her right hip.  We remove the staples in place Steri-Strips.  There is moderate swelling but no significant seroma is drained.  She will continue to increase her activities over the next 4 weeks.  She will stay out of work until then as well.  We will reevaluate her in 4 weeks.  All questions and concerns were answered and addressed.

## 2020-02-29 ENCOUNTER — Telehealth: Payer: Self-pay | Admitting: *Deleted

## 2020-02-29 NOTE — Telephone Encounter (Signed)
Ortho bundle 14 day call completed. 

## 2020-03-17 ENCOUNTER — Inpatient Hospital Stay: Payer: Medicare Other | Attending: Gynecologic Oncology

## 2020-03-17 ENCOUNTER — Other Ambulatory Visit: Payer: Self-pay

## 2020-03-17 ENCOUNTER — Ambulatory Visit (HOSPITAL_COMMUNITY)
Admission: RE | Admit: 2020-03-17 | Discharge: 2020-03-17 | Disposition: A | Discharge status: Home / Self Care | Payer: Medicare Other | Source: Ambulatory Visit | Attending: Gynecologic Oncology | Admitting: Gynecologic Oncology

## 2020-03-17 DIAGNOSIS — Z9049 Acquired absence of other specified parts of digestive tract: Secondary | ICD-10-CM | POA: Insufficient documentation

## 2020-03-17 DIAGNOSIS — N879 Dysplasia of cervix uteri, unspecified: Secondary | ICD-10-CM | POA: Insufficient documentation

## 2020-03-17 DIAGNOSIS — Z808 Family history of malignant neoplasm of other organs or systems: Secondary | ICD-10-CM | POA: Diagnosis not present

## 2020-03-17 DIAGNOSIS — D399 Neoplasm of uncertain behavior of female genital organ, unspecified: Secondary | ICD-10-CM

## 2020-03-17 DIAGNOSIS — R1909 Other intra-abdominal and pelvic swelling, mass and lump: Secondary | ICD-10-CM | POA: Insufficient documentation

## 2020-03-17 DIAGNOSIS — Z780 Asymptomatic menopausal state: Secondary | ICD-10-CM | POA: Insufficient documentation

## 2020-03-17 DIAGNOSIS — N83291 Other ovarian cyst, right side: Secondary | ICD-10-CM | POA: Insufficient documentation

## 2020-03-17 DIAGNOSIS — N9489 Other specified conditions associated with female genital organs and menstrual cycle: Secondary | ICD-10-CM | POA: Insufficient documentation

## 2020-03-17 DIAGNOSIS — Z8249 Family history of ischemic heart disease and other diseases of the circulatory system: Secondary | ICD-10-CM | POA: Insufficient documentation

## 2020-03-17 DIAGNOSIS — Z8719 Personal history of other diseases of the digestive system: Secondary | ICD-10-CM | POA: Insufficient documentation

## 2020-03-17 DIAGNOSIS — R896 Abnormal cytological findings in specimens from other organs, systems and tissues: Secondary | ICD-10-CM | POA: Insufficient documentation

## 2020-03-17 DIAGNOSIS — Z87891 Personal history of nicotine dependence: Secondary | ICD-10-CM | POA: Insufficient documentation

## 2020-03-17 DIAGNOSIS — Z823 Family history of stroke: Secondary | ICD-10-CM | POA: Insufficient documentation

## 2020-03-17 DIAGNOSIS — Z79899 Other long term (current) drug therapy: Secondary | ICD-10-CM | POA: Insufficient documentation

## 2020-03-17 DIAGNOSIS — N838 Other noninflammatory disorders of ovary, fallopian tube and broad ligament: Secondary | ICD-10-CM | POA: Diagnosis not present

## 2020-03-18 LAB — CA 125: Cancer Antigen (CA) 125: 10.1 U/mL (ref 0.0–38.1)

## 2020-03-20 ENCOUNTER — Inpatient Hospital Stay (HOSPITAL_BASED_OUTPATIENT_CLINIC_OR_DEPARTMENT_OTHER): Payer: Medicare Other | Admitting: Gynecologic Oncology

## 2020-03-20 ENCOUNTER — Other Ambulatory Visit: Payer: Self-pay

## 2020-03-20 ENCOUNTER — Encounter: Payer: Self-pay | Admitting: Gynecologic Oncology

## 2020-03-20 VITALS — BP 152/68 | HR 75 | Temp 98.6°F | Resp 18 | Ht 67.0 in | Wt 166.0 lb

## 2020-03-20 DIAGNOSIS — R1909 Other intra-abdominal and pelvic swelling, mass and lump: Secondary | ICD-10-CM | POA: Diagnosis not present

## 2020-03-20 DIAGNOSIS — N83291 Other ovarian cyst, right side: Secondary | ICD-10-CM | POA: Diagnosis not present

## 2020-03-20 DIAGNOSIS — N879 Dysplasia of cervix uteri, unspecified: Secondary | ICD-10-CM | POA: Diagnosis not present

## 2020-03-20 DIAGNOSIS — Z87410 Personal history of cervical dysplasia: Secondary | ICD-10-CM

## 2020-03-20 DIAGNOSIS — R896 Abnormal cytological findings in specimens from other organs, systems and tissues: Secondary | ICD-10-CM | POA: Diagnosis not present

## 2020-03-20 DIAGNOSIS — Z79899 Other long term (current) drug therapy: Secondary | ICD-10-CM | POA: Diagnosis not present

## 2020-03-20 DIAGNOSIS — N9489 Other specified conditions associated with female genital organs and menstrual cycle: Secondary | ICD-10-CM

## 2020-03-20 DIAGNOSIS — Z780 Asymptomatic menopausal state: Secondary | ICD-10-CM | POA: Diagnosis not present

## 2020-03-20 DIAGNOSIS — D399 Neoplasm of uncertain behavior of female genital organ, unspecified: Secondary | ICD-10-CM

## 2020-03-20 NOTE — Patient Instructions (Signed)
Was great to see you today.  I will send my note to Dr. Landry Mellow and your primary care doctor.  My recommendation would be that it is reasonable to repeat an ultrasound in 6-12 months to assure that the cyst is stable.  Additional surveillance is likely unnecessary if continued stability of this right cyst.

## 2020-03-20 NOTE — Progress Notes (Signed)
Gynecologic Oncology Return Clinic Visit  03/20/2020  Reason for Visit: Follow-up in the setting of known right adnexal mass  Treatment History: Pelvic ultrasound exam on 02/28/2003: Normal thickness and appearance of the endometrium. Normal-sized uterus. At least 3 small fibroids. 3.5 cm simple right adnexal cyst likely related to the right ovary. Recommend follow-up in 4-6 months to ensure stability or regression.  Pelvic ultrasound exam on 07/18/2003: Slight interval enlargement of the 3. 8 cm unilocular right ovarian simple cyst without suspicious features.  Pelvic ultrasound exam on 06/01/2004: Uterus measures 9.1 x 3.9 x 5.6 cm with a posterior body intramural fibroid measuring up to 1 cm. Other small fibroids previously noted are not well seen. Endometrium is normal in thickness and homogeneous measuring 9 mm. Essentially no change in a 3.3 x 1.9 x 3.6 cm dominant simple right ovarian cyst without complex features. Left ovary is normal-appearing measuring 4.2 x 1.6 x 3.0 cm with a dominant 1.7 x 1.1 x 1.5 cm simple follicle. No free fluid.  Pelvic ultrasound exam on 03/14/2006: Stable 3.6 x 2.9 x 3.1 cm unilocular right ovarian cyst. Given stability dating back to February 2005, thought to almost certainly be benign. Solitary subcentimeter fibroid.  Pelvic ultrasound exam on 03/25/2008: Anteverted normal-sized uterus with small posterior intramural fibroid measuring 9 mm, stable. Thin postmenopausal appearing endometrium measuring 2.7 mm. Right ovary is largely occupied by a cyst measuring 4.4 x 3.1 x 3.4 cm which has slightly increased in size. There is a small echogenic focus that is seen inferiorly within the cyst measuring approximately 2 mm.  Pelvic ultrasound exam on 08/23/2019: Anteverted normal appearing uterus.  Left ovary normal in size and morphology.  No free fluid.  Cystic mass identified within the right adnexa measuring 6.4 x 5.3 x 5.9 cm.  Scattered internal echoes  versus artifact.  Echogenic focus along a single wall, question calcification versus echogenic mural nodule.  CA-125 on 09/05/2019: 10.9.  Interval History: Patient presents today overall doing very well.  Since her last visit with me, she underwent right hip surgery.  This would stay surgery and she was weightbearing immediately after surgery.  She had 5 home visits but has not had to do physical therapy.  She denies any pelvic pain, vaginal bleeding, or discharge.  She endorses a good appetite without nausea or emesis.  She reports regular bowel and bladder function.  Past Medical/Surgical History: Past Medical History:  Diagnosis Date  . Adnexal mass   . Arthritis   . Colon polyps   . Complication of anesthesia    waking up during ablation  . Cyst of right ovary   . Diabetes mellitus without complication ([place])    type 2  . GERD (gastroesophageal reflux disease)   . History of cervical dysplasia   . History of hiatal hernia   . HLD (hyperlipidemia)   . HTN (hypertension)   . Osteopenia   . PVCs (premature ventricular contractions)    s/p cardiac ablation in 2011    Past Surgical History:  Procedure Laterality Date  . APPENDECTOMY     at same time as her left ovarian cyst removal  . CARDIAC ELECTROPHYSIOLOGY STUDY AND ABLATION    . OVARIAN CYST REMOVAL  1973  . TONSILLECTOMY    . TOTAL HIP ARTHROPLASTY Right 02/15/2020   Procedure: RIGHT TOTAL HIP ARTHROPLASTY ANTERIOR APPROACH;  Surgeon: Mcarthur Rossetti, MD;  Location: [facility];  Service: Orthopedics;  Laterality: Right;  . Speed  Family History  Problem Relation Age of Onset  . Stroke Mother   . Hypertension Father   . Melanoma Brother   . Ovarian cancer Neg Hx   . Uterine cancer Neg Hx   . Breast cancer Neg Hx   . Colon cancer Neg Hx     Social History   Socioeconomic History  . Marital status: Divorced    Spouse name: Not on file  . Number of children: Not on file  . Years of  education: Not on file  . Highest education level: Not on file  Occupational History  . Occupation: Works for [employer]  . Smoking status: Former Smoker    Years: 10.00    Quit date: 1980    Years since quitting: 42.2  . Smokeless tobacco: Never Used  Vaping Use  . Vaping Use: Never used  Substance and Sexual Activity  . Alcohol use: Not Currently    Comment: 2x a year  . Drug use: Not on file  . Sexual activity: Not Currently    Birth control/protection: Surgical  Other Topics Concern  . Not on file  Social History Narrative  . Not on file   Social Determinants of Health   Financial Resource Strain: Not on file  Food Insecurity: Not on file  Transportation Needs: Not on file  Physical Activity: Not on file  Stress: Not on file  Social Connections: Not on file    Current Medications:  Current Outpatient Medications:  .  Alpha-Lipoic Acid 600 MG TABS, Take 600 mg by mouth daily., Disp: , Rfl:  .  Ascorbic Acid (VITAMIN C) 1000 MG tablet, Take 1,000 mg by mouth daily., Disp: , Rfl:  .  aspirin 325 MG tablet, Take 1 tablet by mouth daily in the afternoon., Disp: , Rfl:  .  atorvastatin (LIPITOR) 40 MG tablet, Take 40 mg by mouth daily., Disp: , Rfl:  .  BENFOTIAMINE PO, Take 600 mg by mouth daily., Disp: , Rfl:  .  Calcium Carbonate (CALCIUM-CARB 600 PO), Take 1,200 mg by mouth daily., Disp: , Rfl:  .  Cholecalciferol (VITAMIN D3) 125 MCG (5000 UT) TABS, Take 5,000 Units by mouth 4 (four) times a week., Disp: , Rfl:  .  empagliflozin (JARDIANCE) 10 MG TABS tablet, Take 10 mg by mouth daily., Disp: , Rfl:  .  GLUCOSAMINE-CHONDROITIN PO, Take 1 tablet by mouth in the morning and at bedtime. Triple strength 1500/1200, Disp: , Rfl:  .  hydrochlorothiazide (HYDRODIURIL) 12.5 MG tablet, Take 12.5 mg by mouth daily., Disp: , Rfl:  .  losartan (COZAAR) 50 MG tablet, Take 50 mg by mouth daily., Disp: , Rfl:  .  magnesium gluconate (MAGONATE) 500 MG tablet, Take 500 mg by  mouth daily., Disp: , Rfl:  .  metFORMIN (GLUCOPHAGE) 1000 MG tablet, Take 1,000 mg by mouth 2 (two) times daily with a meal., Disp: , Rfl:  .  methocarbamol (ROBAXIN) 500 MG tablet, Take 1 tablet (500 mg total) by mouth every 6 (six) hours as needed., Disp: 40 tablet, Rfl: 1 .  Multiple Vitamins-Minerals (MULTIVITAMIN WOMENS 50+ ADV) TABS, Take 1 tablet by mouth daily., Disp: , Rfl:  .  Omega-3 Fatty Acids (FISH OIL) 1200 MG CAPS, Take 1,200 mg by mouth in the morning and at bedtime., Disp: , Rfl:  .  oxyCODONE (ROXICODONE) 5 MG immediate release tablet, Take 1-2 tablets (5-10 mg total) by mouth every 6 (six) hours as needed for severe pain., Disp: 30 tablet, Rfl:  0 .  Turmeric 500 MG TABS, Take 500 mg by mouth in the morning and at bedtime., Disp: , Rfl:  .  zinc gluconate 50 MG tablet, Take 50 mg by mouth daily., Disp: , Rfl:   Review of Systems: Denies appetite changes, fevers, chills, fatigue, unexplained weight changes. Denies hearing loss, neck lumps or masses, mouth sores, ringing in ears or voice changes. Denies cough or wheezing.  Denies shortness of breath. Denies chest pain or palpitations. Denies leg swelling. Denies abdominal distention, pain, blood in stools, constipation, diarrhea, nausea, vomiting, or early satiety. Denies pain with intercourse, dysuria, frequency, hematuria or incontinence. Denies hot flashes, pelvic pain, vaginal bleeding or vaginal discharge.   Denies joint pain, back pain or muscle pain/cramps. Denies itching, rash, or wounds. Denies dizziness, headaches, numbness or seizures. Denies swollen lymph nodes or glands, denies easy bruising or bleeding. Denies anxiety, depression, confusion, or decreased concentration.  Physical Exam: BP (!) 152/68 (BP Location: Left Arm, Patient Position: Sitting)   Pulse 75   Temp 98.6 F (37 C) (Tympanic)   Resp 18   Ht 5\' 7"  (1.702 m)   Wt 166 lb (75.3 kg)   SpO2 99% Comment: RA  BMI 26.00 kg/m  General: Alert,  oriented, no acute distress. HEENT: Normocephalic, atraumatic, sclera anicteric. Chest: Unlabored breathing on room air.  Laboratory & Radiologic Studies: 03/17/20: Pelvic ultrasound Right ovary Measurements: 6.2 x 4.9 x 4.9 cm = volume: 78.4 mL. Again noted is a 5.3 x 4.6 x 3.9 cm cystic mass involving the right ovary. This mass previously measured 5.8 x 3.9 x 5.4 cm. Again noted is a small echogenic focus measuring approximately 9 mm. On today's exam and there does appear to be some posterior acoustic shadowing. 1. No acute abnormality 2. Persistent cystic mass involving the right ovary. There is a persistent hyperechoic focus as before, however on today's exam there is some suggestion of shadowing, which can be seen with underlying calcifications. As this cystic mass remains stable across prior studies it is favored to be benign. A 6-12 month follow-up would be useful to document ongoing stability. Alternatively, a contrast enhanced MRI of the pelvis can be performed for a more definitive diagnosis.  CA-125: 10.1  Assessment & Plan: Cheryl Hendricks is a 70 y.o. woman with stable cystic adnexal mass.  I reviewed her recent ultrasound and CA-125.  CA-125 is unchanged from last fall and within normal limits.  Ultrasound shows stable to slightly decreased dimensions of the adnexal cyst.  There is some findings that increase suspicion for calcification, not a mural nodule.  Patient continues to be asymptomatic.  We reviewed again today ultrasound findings from as early as 2005.  While there has been a small amount of growth in the cyst on her right ovary over 16-17 years, overall over the last 3 ultrasounds, there has been no significant change in size.  I offered 2 options moving forward.  I think it would be reasonable to stop imaging surveillance completely at this time.  I also think it would be reasonable to get 1 additional ultrasound in 6-12 months.  If there is been no significant change  to the adnexal cyst at this time, then I would recommend stopping routine imaging surveillance.  She will develop new symptoms, then that would be a reason to get follow-up imaging.  We reviewed again as well her history of cervical dysplasia.  She had at least 20 years of normal Paps after she was treated for an abnormal  Pap with cryotherapy.  Given her age and remote history of abnormal Paps, I would recommend stopping routine screening.  38 minutes of total time was spent for this patient encounter, including preparation, face-to-face counseling with the patient and coordination of care, and documentation of the encounter.  Jeral Pinch, MD  Division of Gynecologic Oncology  Department of Obstetrics and Gynecology  [facility]

## 2020-03-21 ENCOUNTER — Telehealth: Payer: Self-pay | Admitting: *Deleted

## 2020-03-21 NOTE — Telephone Encounter (Signed)
Ortho bundle 30 day call completed. °

## 2020-03-31 ENCOUNTER — Ambulatory Visit (INDEPENDENT_AMBULATORY_CARE_PROVIDER_SITE_OTHER): Payer: Medicare Other | Admitting: Orthopaedic Surgery

## 2020-03-31 ENCOUNTER — Encounter: Payer: Self-pay | Admitting: Orthopaedic Surgery

## 2020-03-31 DIAGNOSIS — Z96641 Presence of right artificial hip joint: Secondary | ICD-10-CM

## 2020-03-31 NOTE — Progress Notes (Signed)
The patient comes in today's 6-week status post a right total hip arthroplasty.  She is doing well overall.  She works at both the [employer] in the [employer].  She feels comfortable with getting back to her work at [employer] this week but not the [employer] until June.  I agree with this.  Her right operative hip is still somewhat stiff but moves better than it did preoperatively.  She looks good overall.  She is ambulating without an assistive device.  We will see her back in 3 months to see how she is doing overall.  At that visit I like a standing low AP pelvis and lateral right operative hip.  All questions and concerns were answered and addressed.

## 2020-04-01 DIAGNOSIS — E119 Type 2 diabetes mellitus without complications: Secondary | ICD-10-CM | POA: Diagnosis not present

## 2020-04-01 DIAGNOSIS — H2513 Age-related nuclear cataract, bilateral: Secondary | ICD-10-CM | POA: Diagnosis not present

## 2020-04-01 DIAGNOSIS — H18451 Nodular corneal degeneration, right eye: Secondary | ICD-10-CM | POA: Diagnosis not present

## 2020-05-01 DIAGNOSIS — Z23 Encounter for immunization: Secondary | ICD-10-CM | POA: Diagnosis not present

## 2020-05-14 ENCOUNTER — Telehealth: Payer: Self-pay | Admitting: *Deleted

## 2020-05-14 NOTE — Telephone Encounter (Signed)
Attempted 90 day Ortho bundle call to patient; no answer and left VM requesting call back.

## 2020-05-30 ENCOUNTER — Other Ambulatory Visit: Payer: Self-pay | Admitting: Internal Medicine

## 2020-05-30 DIAGNOSIS — Z1231 Encounter for screening mammogram for malignant neoplasm of breast: Secondary | ICD-10-CM

## 2020-06-30 ENCOUNTER — Ambulatory Visit: Payer: Medicare Other | Admitting: Orthopaedic Surgery

## 2020-07-01 ENCOUNTER — Encounter: Payer: Self-pay | Admitting: Orthopaedic Surgery

## 2020-07-01 ENCOUNTER — Ambulatory Visit (INDEPENDENT_AMBULATORY_CARE_PROVIDER_SITE_OTHER): Payer: Medicare Other | Admitting: Orthopaedic Surgery

## 2020-07-01 ENCOUNTER — Ambulatory Visit (INDEPENDENT_AMBULATORY_CARE_PROVIDER_SITE_OTHER): Payer: Medicare Other

## 2020-07-01 DIAGNOSIS — Z96641 Presence of right artificial hip joint: Secondary | ICD-10-CM

## 2020-07-01 DIAGNOSIS — M2170 Unequal limb length (acquired), unspecified site: Secondary | ICD-10-CM | POA: Diagnosis not present

## 2020-07-01 NOTE — Progress Notes (Signed)
The patient comes today almost 5 months status post a right total hip arthroplasty.  She is very pleased with the hip and is making good progress.  She does have a leg length discrepancy with the right side longer than the left.  It is stable and will try to optimize what we could in the operating room to have her equal but we were not able to accomplish that but we have a stable hip.  She is interested and an orthotics company they can provide an insert within her shoe on the left side could help equal her.  She reports better function with her hip abductors and overall she is very pleased.  Examination right hip shows it moves smoothly and fluidly.  When I have her lay in a supine position there is a leg length discrepancy with the right side longer than the left.  X-rays show well-seated implant with a right hip replacement with no complicating features.  I will send her to [facility] with a prescription for an insert for her left shoe to hopefully equalize her leg length.  All questions and concerns were answered and addressed.  We will see her back for final visit in 8 months at the 1 year standpoint with surgery with a standing low AP pelvis.  All questions and concerns were answered and addressed.

## 2020-07-07 ENCOUNTER — Other Ambulatory Visit: Payer: Self-pay

## 2020-07-07 ENCOUNTER — Ambulatory Visit
Admission: RE | Admit: 2020-07-07 | Discharge: 2020-07-07 | Disposition: A | Discharge status: Home / Self Care | Payer: Medicare Other | Source: Ambulatory Visit | Attending: Internal Medicine | Admitting: Internal Medicine

## 2020-07-07 DIAGNOSIS — Z1231 Encounter for screening mammogram for malignant neoplasm of breast: Secondary | ICD-10-CM | POA: Diagnosis not present

## 2020-07-11 ENCOUNTER — Telehealth: Payer: Self-pay | Admitting: *Deleted

## 2020-07-11 NOTE — Telephone Encounter (Signed)
Attempted 90 day call again. No answer and left VM.

## 2020-07-28 ENCOUNTER — Ambulatory Visit: Payer: Medicare Other

## 2020-08-06 DIAGNOSIS — E7800 Pure hypercholesterolemia, unspecified: Secondary | ICD-10-CM | POA: Diagnosis not present

## 2020-08-06 DIAGNOSIS — Z0000 Encounter for general adult medical examination without abnormal findings: Secondary | ICD-10-CM | POA: Diagnosis not present

## 2020-08-06 DIAGNOSIS — N9489 Other specified conditions associated with female genital organs and menstrual cycle: Secondary | ICD-10-CM | POA: Diagnosis not present

## 2020-08-06 DIAGNOSIS — I10 Essential (primary) hypertension: Secondary | ICD-10-CM | POA: Diagnosis not present

## 2020-08-06 DIAGNOSIS — Z1389 Encounter for screening for other disorder: Secondary | ICD-10-CM | POA: Diagnosis not present

## 2020-08-06 DIAGNOSIS — E1169 Type 2 diabetes mellitus with other specified complication: Secondary | ICD-10-CM | POA: Diagnosis not present

## 2020-08-06 DIAGNOSIS — M169 Osteoarthritis of hip, unspecified: Secondary | ICD-10-CM | POA: Diagnosis not present

## 2020-09-11 DIAGNOSIS — Z23 Encounter for immunization: Secondary | ICD-10-CM | POA: Diagnosis not present

## 2020-11-26 ENCOUNTER — Other Ambulatory Visit: Payer: Self-pay | Admitting: Internal Medicine

## 2020-11-26 DIAGNOSIS — N9489 Other specified conditions associated with female genital organs and menstrual cycle: Secondary | ICD-10-CM

## 2020-11-28 ENCOUNTER — Telehealth: Payer: Self-pay | Admitting: *Deleted

## 2020-11-28 NOTE — Telephone Encounter (Signed)
Ortho bundle 90 day call and survey completed. Patient seen in office at 9 months post op. Unable to reach at 90 day mark x 3 attempts.

## 2020-12-11 ENCOUNTER — Telehealth: Payer: Self-pay | Admitting: Gynecologic Oncology

## 2020-12-11 ENCOUNTER — Ambulatory Visit
Admission: RE | Admit: 2020-12-11 | Discharge: 2020-12-11 | Disposition: A | Discharge status: Home / Self Care | Payer: Medicare Other | Source: Ambulatory Visit | Attending: Internal Medicine | Admitting: Internal Medicine

## 2020-12-11 DIAGNOSIS — N9489 Other specified conditions associated with female genital organs and menstrual cycle: Secondary | ICD-10-CM

## 2020-12-11 DIAGNOSIS — N83201 Unspecified ovarian cyst, right side: Secondary | ICD-10-CM | POA: Diagnosis not present

## 2020-12-11 DIAGNOSIS — N888 Other specified noninflammatory disorders of cervix uteri: Secondary | ICD-10-CM | POA: Diagnosis not present

## 2020-12-11 DIAGNOSIS — N83202 Unspecified ovarian cyst, left side: Secondary | ICD-10-CM | POA: Diagnosis not present

## 2020-12-11 NOTE — Telephone Encounter (Addendum)
Called the patient, discussed ultrasound findings. Overall reassuring. Discussed options moving forward. Patient is interested in pelvic MRI to better characterize, which I think is reasonable.  I will have my office call her to schedule, hopefully before the end of the year.  Depending on MRI results, we will make a decision about future imaging.  Jeral Pinch MD Gynecologic Oncology

## 2020-12-12 ENCOUNTER — Telehealth: Payer: Self-pay | Admitting: *Deleted

## 2020-12-12 NOTE — Telephone Encounter (Signed)
Per Dr Berline Lopes, scheduled the patient for a MRI scan on 12/13 at 7 am. Called and the patient a left with the appt date/time and instructions

## 2020-12-23 ENCOUNTER — Other Ambulatory Visit: Payer: Self-pay

## 2020-12-23 ENCOUNTER — Ambulatory Visit (HOSPITAL_COMMUNITY)
Admission: RE | Admit: 2020-12-23 | Discharge: 2020-12-23 | Disposition: A | Discharge status: Home / Self Care | Payer: Medicare Other | Source: Ambulatory Visit | Attending: Gynecologic Oncology | Admitting: Gynecologic Oncology

## 2020-12-23 DIAGNOSIS — K5730 Diverticulosis of large intestine without perforation or abscess without bleeding: Secondary | ICD-10-CM | POA: Diagnosis not present

## 2020-12-23 DIAGNOSIS — N9489 Other specified conditions associated with female genital organs and menstrual cycle: Secondary | ICD-10-CM | POA: Insufficient documentation

## 2020-12-23 DIAGNOSIS — Z780 Asymptomatic menopausal state: Secondary | ICD-10-CM | POA: Diagnosis not present

## 2020-12-23 DIAGNOSIS — N83201 Unspecified ovarian cyst, right side: Secondary | ICD-10-CM | POA: Diagnosis not present

## 2020-12-23 DIAGNOSIS — Z96641 Presence of right artificial hip joint: Secondary | ICD-10-CM | POA: Diagnosis not present

## 2020-12-23 MED ORDER — GADOBUTROL 1 MMOL/ML IV SOLN
7.0000 mL | Freq: Once | INTRAVENOUS | Status: AC | PRN
  Administered 2020-12-23: 7 mL via INTRAVENOUS

## 2020-12-24 ENCOUNTER — Telehealth: Payer: Self-pay | Admitting: Gynecologic Oncology

## 2020-12-24 NOTE — Telephone Encounter (Signed)
Reviewed MRI findings with the patient, overall MRI very reassuring. I think we could consider repeat MRI in 6-12 months to assure stable size and appearance of the mass. If imaging were unchanged, I think routine imaging can be discontinued as long as she remains asymptomatic.  Jeral Pinch MD Gynecologic Oncology

## 2021-01-28 ENCOUNTER — Other Ambulatory Visit: Payer: Self-pay | Admitting: Gynecologic Oncology

## 2021-01-28 DIAGNOSIS — N9489 Other specified conditions associated with female genital organs and menstrual cycle: Secondary | ICD-10-CM

## 2021-02-06 DIAGNOSIS — E7800 Pure hypercholesterolemia, unspecified: Secondary | ICD-10-CM | POA: Diagnosis not present

## 2021-02-06 DIAGNOSIS — E1169 Type 2 diabetes mellitus with other specified complication: Secondary | ICD-10-CM | POA: Diagnosis not present

## 2021-02-06 DIAGNOSIS — N83201 Unspecified ovarian cyst, right side: Secondary | ICD-10-CM | POA: Diagnosis not present

## 2021-02-06 DIAGNOSIS — I493 Ventricular premature depolarization: Secondary | ICD-10-CM | POA: Diagnosis not present

## 2021-02-06 DIAGNOSIS — M8580 Other specified disorders of bone density and structure, unspecified site: Secondary | ICD-10-CM | POA: Diagnosis not present

## 2021-02-06 DIAGNOSIS — I10 Essential (primary) hypertension: Secondary | ICD-10-CM | POA: Diagnosis not present

## 2021-02-19 ENCOUNTER — Telehealth: Payer: Self-pay | Admitting: *Deleted

## 2021-02-19 NOTE — Telephone Encounter (Signed)
Per Dr Berline Lopes scheduled the Cheryl Hendricks for an MRI on 10/30 at 10 am. Cheryl Hendricks given the date/time of appt

## 2021-03-02 ENCOUNTER — Ambulatory Visit (INDEPENDENT_AMBULATORY_CARE_PROVIDER_SITE_OTHER): Payer: Medicare Other

## 2021-03-02 ENCOUNTER — Ambulatory Visit (INDEPENDENT_AMBULATORY_CARE_PROVIDER_SITE_OTHER): Payer: Medicare Other | Admitting: Orthopaedic Surgery

## 2021-03-02 ENCOUNTER — Encounter: Payer: Self-pay | Admitting: Orthopaedic Surgery

## 2021-03-02 ENCOUNTER — Other Ambulatory Visit: Payer: Self-pay

## 2021-03-02 DIAGNOSIS — Z96641 Presence of right artificial hip joint: Secondary | ICD-10-CM

## 2021-03-02 NOTE — Progress Notes (Signed)
The patient will be 71 years old tomorrow.  She is a year out from a right total hip arthroplasty.  She still reports some slight weakness but has good range of motion she states.  She states she is doing well overall and ambulates very well.  Her right hip moves smoothly and fluidly with no pain in the groin at all.  An AP pelvis shows a well-seated right total hip arthroplasty with no complicating features.  Her left hip appears normal.  At this point follow-up can be as needed for her right hip.  She understands if there is any issues at all do not hesitate to let us know.  All questions and concerns were answered and addressed.

## 2021-03-09 ENCOUNTER — Telehealth: Payer: Self-pay | Admitting: *Deleted

## 2021-03-09 NOTE — Telephone Encounter (Signed)
Ortho bundle 1 year post op call completed. Will update patient's dental office per her request regarding no dental prophylaxis needed at this point post operatively.

## 2021-04-06 DIAGNOSIS — E119 Type 2 diabetes mellitus without complications: Secondary | ICD-10-CM | POA: Diagnosis not present

## 2021-04-07 ENCOUNTER — Other Ambulatory Visit: Payer: Self-pay

## 2021-04-07 ENCOUNTER — Ambulatory Visit (INDEPENDENT_AMBULATORY_CARE_PROVIDER_SITE_OTHER): Payer: Medicare Other

## 2021-04-07 ENCOUNTER — Ambulatory Visit (INDEPENDENT_AMBULATORY_CARE_PROVIDER_SITE_OTHER): Payer: Medicare Other | Admitting: Orthopaedic Surgery

## 2021-04-07 ENCOUNTER — Encounter: Payer: Self-pay | Admitting: Orthopaedic Surgery

## 2021-04-07 DIAGNOSIS — M79604 Pain in right leg: Secondary | ICD-10-CM | POA: Diagnosis not present

## 2021-04-07 MED ORDER — TRAMADOL HCL 50 MG PO TABS: 100.0000 mg | ORAL_TABLET | Freq: Four times a day (QID) | ORAL | 0 refills | Status: AC | PRN

## 2021-04-07 MED ORDER — GABAPENTIN 300 MG PO CAPS: 300.0000 mg | ORAL_CAPSULE | Freq: Every day | ORAL | 1 refills | Status: AC

## 2021-04-07 MED ORDER — CELECOXIB 200 MG PO CAPS: 200.0000 mg | ORAL_CAPSULE | Freq: Two times a day (BID) | ORAL | 1 refills | Status: AC | PRN

## 2021-04-07 NOTE — Progress Notes (Signed)
The patient comes in today for evaluation treatment of acute right-sided low back pain with radicular symptoms going down her right leg.  I actually replaced her right hip in February 2022.  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  Since then she has developed this radicular pain going down her right leg.  She is a diabetic and has been under good control over the years now.  However she did have a higher hemoglobin A1c of 6.9 after traveling to [place] and eating different diet that did increase her blood glucose.  She is reporting better blood glucose now.  However the radicular pain she is experiencing is causing her to not get any sleep at night at all.  She denies any change in bowel bladder function or weakness in her right leg.  The pain does radiate down to her ankle. ? ?On exam her right hip moves smoothly and fluidly.  She does have a positive straight leg raise on the right side.  There is subjective numbness in the L4 and L5 distribution.  There is no loss of muscle tone. ? ?2 views of the lumbar spine show disc space narrowing between L4 and L5 but otherwise normal alignment. ? ?We will send in a combination of Celebrex to take twice a day as needed as an anti-inflammatory combined with tramadol and even Neurontin to take at bedtime.  I will reevaluate her in a week.  She will work on back extension exercises in the interim as well.  All questions and concerns were answered and addressed. ?

## 2021-04-15 ENCOUNTER — Ambulatory Visit (INDEPENDENT_AMBULATORY_CARE_PROVIDER_SITE_OTHER): Payer: Medicare Other | Admitting: Orthopaedic Surgery

## 2021-04-15 ENCOUNTER — Other Ambulatory Visit: Payer: Self-pay

## 2021-04-15 ENCOUNTER — Encounter: Payer: Self-pay | Admitting: Orthopaedic Surgery

## 2021-04-15 DIAGNOSIS — M5416 Radiculopathy, lumbar region: Secondary | ICD-10-CM

## 2021-04-15 NOTE — Progress Notes (Signed)
HPI: Mrs. Kenealy returns today for follow-up of her low back pain with radicular symptoms down the leg right thigh.  She never had any numbness tingling just had achy pain down the thigh.  She states she is overall 90% better.  She feels the Celebrex definitely helped.  She initially took quite a bit of tramadol but is basically taking no tramadol at this point time she continues to take Neurontin at night.  She does have some exercises that she needs done in the past for her back at home has had no formal therapy for back. ? ?Review of systems: See HPI ? ?Physical exam: Bilateral hips good range of motion without pain.  Slight tenderness over the right hip trochanteric region.  Negative straight leg raise bilaterally. ? ?Impression: Lumbar degenerative disc disease L4-L5 with radiculopathy ? ?Plan: We will send her to formal therapy this is just to gain a home exercise program for her back and stretching particularly her IT band and hamstrings.  She most likely will only need 2 or 3 visits.  She will follow-up with [place] as needed.  Questions encouraged and answered. ?

## 2021-04-20 NOTE — Therapy (Signed)
?OUTPATIENT PHYSICAL THERAPY THORACOLUMBAR EVALUATION ? ? ?Patient Name: Cheryl Hendricks ?MRN: [id number] ?DOB:03-10-1950, 71 y.o., female ?Today's Date: 04/21/2021 ? ? PT End of Session - 04/21/21 1734   ? ? Visit Number 1   ? Number of Visits 8   ? Date for PT Re-Evaluation 06/16/21   ? Authorization Type MCR A and B [employer]   ? Progress Note Due on Visit 10   ? PT Start Time 1021   ? PT Stop Time 1100   ? PT Time Calculation (min) 39 min   ? Activity Tolerance Patient tolerated treatment well   ? Behavior During Therapy [facility] for tasks assessed/performed   ? ?  ?  ? ?  ? ? ?Past Medical History:  ?Diagnosis Date  ? Adnexal mass   ? Arthritis   ? Colon polyps   ? Complication of anesthesia   ? waking up during ablation  ? Cyst of right ovary   ? Diabetes mellitus without complication ([place])   ? type 2  ? GERD (gastroesophageal reflux disease)   ? History of cervical dysplasia   ? History of hiatal hernia   ? HLD (hyperlipidemia)   ? HTN (hypertension)   ? Osteopenia   ? PVCs (premature ventricular contractions)   ? s/p cardiac ablation in 2011  ? ?Past Surgical History:  ?Procedure Laterality Date  ? APPENDECTOMY    ? at same time as her left ovarian cyst removal  ? CARDIAC ELECTROPHYSIOLOGY STUDY AND ABLATION    ? OVARIAN CYST REMOVAL  1973  ? TONSILLECTOMY    ? TOTAL HIP ARTHROPLASTY Right 02/15/2020  ? Procedure: RIGHT TOTAL HIP ARTHROPLASTY ANTERIOR APPROACH;  Surgeon: Mcarthur Rossetti, MD;  Location: [facility];  Service: Orthopedics;  Laterality: Right;  ? TUBAL LIGATION  1996  ? ?Patient Active Problem List  ? Diagnosis Date Noted  ? Status post total replacement of right hip 02/28/2020  ? History of cervical dysplasia 09/19/2019  ? Adnexal mass 09/19/2019  ? Unilateral primary osteoarthritis, right hip 08/15/2019  ? ? ?PCP: Wenda Low, MD ? ?REFERRING PROVIDER: Mcarthur Rossetti* ? ?REFERRING DIAG: M54.16 (ICD-10-CM) - Radiculopathy, lumbar region ? ?THERAPY DIAG:  ?Chronic low back pain, unspecified  back pain laterality, unspecified whether sciatica present ? ?Muscle weakness (generalized) ? ?Other abnormalities of gait and mobility ? ?ONSET DATE:  April 04, 2021 ? ?SUBJECTIVE:                                                                                                                                                                                          ? ?SUBJECTIVE STATEMENT: ?I was lifting  boxes and I irritated my back  04-04-21 and I used improper body mechanics.  I presently have no pain but I still have weakness from my RTHA 02/15/20 and I was in so much pain the night I brought the books to the [employer] that I could barely move.  I have benefited from the medication I was given gabapentin and tramadol but I am no longer taking now.  I want a home program that I can use to prevent alleviate pain and get stronger  ?PERTINENT HISTORY:  ?Adnexal mass, DM, GERD, hiatial hernia, HTN, HLD, Ostepenia, PVCS, THA 02/15/20, tonsillectomy, Ovarian cyst removal, ablation ? ?PAIN:  ?Are you having pain? Yes: NPRS scale: 0/10 ?Pain location: running on R side gluts to anterior thigh to R ankle but no longer a problems ?Pain description: femoral nerve pain L-2/L-3 all anterior thigh pain ?Aggravating factors: lifting books and boxes initiated. And then sleeping at night in March 2023 ?Relieving factors: medication, icing before seing MD ? ? ?PRECAUTIONS: None ? ?WEIGHT BEARING RESTRICTIONS No ? ?FALLS:  ?Has patient fallen in last 6 months? No ? ?LIVING ENVIRONMENT: ?Lives with: lives alone ?Lives in: House/apartment ?Stairs: No ?Has following equipment at home: Single point cane and Walker - 2 wheeled ? 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 ?PLOF: Independent ? ?PATIENT GOALS I want to learn how I can decrease pain when my nerves are irritated.  ? ? ?OBJECTIVE:  ? ?DIAGNOSTIC FINDINGS:  ?Narrative ?04-07-21 ?2 views of the lumbar spine show no acute findings.  There is disc base   ?narrowing between L4 and L5.  The alignment is well-maintained.  ? ? ?PATIENT SURVEYS:  ?FOTO 94% ? ?SCREENING FOR RED FLAGS: ?Bowel or bladder incontinence: No ?Spinal tumors: No ?Cauda equina syndrome: No ?Compression fracture: No ?Abdominal aneurysm: No ? ?COGNITION: ? Overall cognitive status: Within functional limits for tasks assessed   ?  ?SENSATION: ?WFL ?Had incident in March that caused femoral nerve irritation ?MUSCLE LENGTH: ?Hamstrings: Right 60 deg; Left 65 deg ? ? ?POSTURE:  ?Pt with LLD R higher than left ?Had a lift in left shoe and has been using pre op. I went to [employer] and I have a 1/2 inch lift in Left shoe ?PALPATION: ?Minimal TTP over Low back, slight tightness over R Lumbar ? ?LUMBAR ROM:  ? ?Active  A/PROM  ?04/21/2021  ?Flexion Finger tip to ankle  ?Extension 75%  ?Right lateral flexion Fingertip to knee jt line  ?Left lateral flexion Fingertip to knee jt line  ?Right rotation WFL  ?Left rotation WFL  ? (Blank rows = not tested) ? ?LE ROM: ? ?Active  Right ?04/21/2021 Left ?04/21/2021  ?Hip flexion 95 100  ?Hip extension    ?Hip abduction    ?Hip adduction    ?Hip internal rotation    ?Hip external rotation    ?Knee flexion    ?Knee extension    ?Ankle dorsiflexion    ?Ankle plantarflexion    ?Ankle inversion    ?Ankle eversion    ? (Blank rows = not tested) ? ?LE MMT: ? ?MMT Right ?04/21/2021 Left ?04/21/2021  ?Hip flexion 4- 4+  ?Hip extension 4- 4+  ?Hip abduction 4- 4  ?Hip adduction    ?Hip internal rotation    ?Hip external rotation    ?Knee flexion 4 4+  ?Knee extension 4 4+  ?Ankle dorsiflexion    ?Ankle plantarflexion    ?Ankle inversion    ?Ankle eversion    ? (  Blank rows = not tested) ? ?LUMBAR SPECIAL TESTS:  ?NT ? ?FUNCTIONAL TESTS:  ?5 times sit to stand: 18.89 sec ?6 minute walk test: TBD ? ?GAIT: ?Distance walked: 150 to clinic ?Assistive device utilized: None ?Level of assistance: Complete Independence ?Comments: Pt has equipment but no need Step through pattern  LLD R  >L ? ? ? ?TODAY'S TREATMENT  ? ? ?04-21-21 Eval and initial HEP ? ? ?PATIENT EDUCATION:  ?Education details: POC, FOTO explanation, explanation of findings, inital HEP ?Person educated: Patient ?Education method: Explanation, Demonstration, Tactile cues, Verbal cues, and Handouts ?Education comprehension: verbalized understanding, returned demonstration, verbal cues required, tactile cues required, and needs further education ? ? ?HOME EXERCISE PROGRAM:   ? Access Code: [email or web address] ?URL: [email or web address] ?Date: 04/21/2021 ?Prepared by: Voncille Lo ? ?Program Notes ?Do sit to stand every day 3 x a day for 10 x before your eat breakfast, lunch and dinner ? ?Exercises ?- Prone Femoral Nerve Mobilization  - 1 x daily - 7 x weekly - 3 sets - 10 reps ?- Sidelying Femoral Nerve Glide - Top Leg  - 1 x daily - 7 x weekly - 3 sets - 10 reps ?- Sit to stand with sink support Movement snack  - 1 x daily - 7 x weekly - 3 sets - 10 reps ?- Sidelying Femoral Nerve Glide  - 1 x daily - 7 x weekly - 3 sets - 10 reps ? ?Past HEP [id number] ? ?ASSESSMENT: ? ?CLINICAL IMPRESSION: ?Patient is a 71 y.o. female who was seen today for physical therapy evaluation and treatment for Lumbar DDD L4-L-5 with radiculopathy and to reinforce an HEP to control symptoms with a progressive HEP. Pt with R THA 02-14-21 with anterior approach with Dr Zollie Beckers. Pt presents with R Hip weakness in hip ext and abd and will benefit and hx of low back femoral nerve pain after improperly lifting box of books. Pt will benefit from skilled PT to address impairments and return to pain free and stronger level of function to continue successfully living on her own. ? ? ?OBJECTIVE IMPAIRMENTS decreased ROM, decreased strength, impaired flexibility, improper body mechanics, and pain.  ? ?ACTIVITY LIMITATIONS fearful of reinjuriing back with lifting.  ? ?PERSONAL FACTORS Adnexal mass, DM, GERD, hiatial hernia, HTN, HLD, Ostepenia, PVCS, THA  02/15/20, tonsillectomy, Ovarian cyst removal, ablation are also affecting patient's functional outcome.  ? ? ?REHAB POTENTIAL: Excellent ? ?CLINICAL DECISION MAKING: Evolving/moderate complexity ? ?EVALUATION COMPLEX

## 2021-04-21 ENCOUNTER — Ambulatory Visit: Payer: Medicare Other | Attending: Orthopaedic Surgery | Admitting: Physical Therapy

## 2021-04-21 DIAGNOSIS — M545 Low back pain, unspecified: Secondary | ICD-10-CM | POA: Diagnosis not present

## 2021-04-21 DIAGNOSIS — M5416 Radiculopathy, lumbar region: Secondary | ICD-10-CM | POA: Insufficient documentation

## 2021-04-21 DIAGNOSIS — R2689 Other abnormalities of gait and mobility: Secondary | ICD-10-CM | POA: Insufficient documentation

## 2021-04-21 DIAGNOSIS — M25551 Pain in right hip: Secondary | ICD-10-CM | POA: Insufficient documentation

## 2021-04-21 DIAGNOSIS — M6281 Muscle weakness (generalized): Secondary | ICD-10-CM | POA: Diagnosis not present

## 2021-04-21 DIAGNOSIS — G8929 Other chronic pain: Secondary | ICD-10-CM | POA: Insufficient documentation

## 2021-04-21 NOTE — Patient Instructions (Addendum)
Access Code: [email or web address] ?URL: [email or web address] ?Date: 04/21/2021 ?Prepared by: Voncille Lo ? ?Program Notes ?Do sit to stand every day 3 x a day for 10 x before your eat breakfast, lunch and dinner ? ?Exercises ?- Prone Femoral Nerve Mobilization  - 1 x daily - 7 x weekly - 3 sets - 10 reps ?- Sidelying Femoral Nerve Glide - Top Leg  - 1 x daily - 7 x weekly - 3 sets - 10 reps ?- Sit to stand with sink support Movement snack  - 1 x daily - 7 x weekly - 3 sets - 10 reps ?- Sidelying Femoral Nerve Glide  - 1 x daily - 7 x weekly - 3 sets - 10 reps ? ?Voncille Lo, PT, [facility] ?Certified Exercise Expert for the Aging Adult  ?04/21/21 10:42 AM ?Phone: [phone] ?Fax: [phone]  ?

## 2021-04-22 NOTE — Therapy (Signed)
?OUTPATIENT PHYSICAL THERAPY THORACOLUMBAR EVALUATION ? ? ?Patient Name: Cheryl Hendricks ?MRN: [id number] ?DOB:[birth year], 71 y.o., female ?Today's Date: 04/23/2021 ? ? PT End of Session - 04/23/21 1230   ? ? Visit Number 2   ? Number of Visits 8   ? Date for PT Re-Evaluation 06/16/21   ? Authorization Type MCR A and B [employer]   ? Progress Note Due on Visit 10   ? PT Start Time [phone]   ? PT Stop Time 1016   ? PT Time Calculation (min) 38 min   ? ?  ?  ? ?  ? ? ? ?Past Medical History:  ?Diagnosis Date  ? Adnexal mass   ? Arthritis   ? Colon polyps   ? Complication of anesthesia   ? waking up during ablation  ? Cyst of right ovary   ? Diabetes mellitus without complication ([place])   ? type 2  ? GERD (gastroesophageal reflux disease)   ? History of cervical dysplasia   ? History of hiatal hernia   ? HLD (hyperlipidemia)   ? HTN (hypertension)   ? Osteopenia   ? PVCs (premature ventricular contractions)   ? s/p cardiac ablation in 2011  ? ?Past Surgical History:  ?Procedure Laterality Date  ? APPENDECTOMY    ? at same time as her left ovarian cyst removal  ? CARDIAC ELECTROPHYSIOLOGY STUDY AND ABLATION    ? OVARIAN CYST REMOVAL  1973  ? TONSILLECTOMY    ? TOTAL HIP ARTHROPLASTY Right 02/15/2020  ? Procedure: RIGHT TOTAL HIP ARTHROPLASTY ANTERIOR APPROACH;  Surgeon: Mcarthur Rossetti, MD;  Location: [facility];  Service: Orthopedics;  Laterality: Right;  ? TUBAL LIGATION  1996  ? ?Patient Active Problem List  ? Diagnosis Date Noted  ? Status post total replacement of right hip 02/28/2020  ? History of cervical dysplasia 09/19/2019  ? Adnexal mass 09/19/2019  ? Unilateral primary osteoarthritis, right hip 08/15/2019  ? ? ?PCP: Wenda Low, MD ? ?REFERRING PROVIDER: Mcarthur Rossetti* ? ?REFERRING DIAG: M54.16 (ICD-10-CM) - Radiculopathy, lumbar region ? ?THERAPY DIAG:  ?Chronic low back pain, unspecified back pain laterality, unspecified whether sciatica present ? ?Muscle weakness (generalized) ? ?Other abnormalities of  gait and mobility ? ?Pain in right hip ? ?ONSET DATE:  April 04, 2021 ? ?SUBJECTIVE:                                                                                                                                                                                          ? ?SUBJECTIVE STATEMENT: ? ?I am sorry about being late.  I was afraid to do my exercises because  I was so good about adhering to my precautions but my surgery was over a year ago. I just need some guidance with my exercise ? ?EVAL- I was lifting boxes and I irritated my back  04-04-21 and I used improper body mechanics.  I presently have no pain but I still have weakness from my RTHA 02/15/20 and I was in so much pain the night I brought the books to the [employer] that I could barely move.  I have benefited from the medication I was given gabapentin and tramadol but I am no longer taking now.  I want a home program that I can use to prevent alleviate pain and get stronger  ? ?PERTINENT HISTORY:  ?Adnexal mass, DM, GERD, hiatial hernia, HTN, HLD, Ostepenia, PVCS, THA 02/15/20, tonsillectomy, Ovarian cyst removal, ablation ? ?PAIN:  ?Are you having pain? Yes: NPRS scale: 0/10 ?Pain location: running on R side gluts to anterior thigh to R ankle but no longer a problems ?Pain description: femoral nerve pain L-2/L-3 all anterior thigh pain ?Aggravating factors: lifting books and boxes initiated. And then sleeping at night in March 2023 ?Relieving factors: medication, icing before seing MD ? ? ?PRECAUTIONS: None ? ?WEIGHT BEARING RESTRICTIONS No ? ?FALLS:  ?Has patient fallen in last 6 months? No ? ?LIVING ENVIRONMENT: ?Lives with: lives alone ?Lives in: House/apartment ?Stairs: No ?Has following equipment at home: Single point cane and Walker - 2 wheeled ? 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 ?PLOF: Independent ? ?PATIENT GOALS I want to learn how I can decrease pain when my nerves are irritated.  ? ? ?OBJECTIVE:   ? ?DIAGNOSTIC FINDINGS:  ?Narrative ?04-07-21 ?2 views of the lumbar spine show no acute findings.  There is disc base  ?narrowing between L4 and L5.  The alignment is well-maintained.  ? ? ?PATIENT SURVEYS:  ?FOTO 94% ? ?SCREENING FOR RED FLAGS: ?Bowel or bladder incontinence: No ?Spinal tumors: No ?Cauda equina syndrome: No ?Compression fracture: No ?Abdominal aneurysm: No ? ?COGNITION: ? Overall cognitive status: Within functional limits for tasks assessed   ?  ?SENSATION: ?WFL ?Had incident in March that caused femoral nerve irritation ?MUSCLE LENGTH: ?Hamstrings: Right 60 deg; Left 65 deg ? ? ?POSTURE:  ?Pt with LLD R higher than left ?Had a lift in left shoe and has been using pre op. I went to [facility] and I have a 1/2 inch lift in Left shoe ?PALPATION: ?Minimal TTP over Low back, slight tightness over R Lumbar ? ?LUMBAR ROM:  ? ?Active  A/PROM  ?04/23/2021  ?Flexion Finger tip to ankle  ?Extension 75%  ?Right lateral flexion Fingertip to knee jt line  ?Left lateral flexion Fingertip to knee jt line  ?Right rotation WFL  ?Left rotation WFL  ? (Blank rows = not tested) ? ?LE ROM: ? ?Active  Right ?04/23/2021 Left ?04/23/2021  ?Hip flexion 95 100  ?Hip extension    ?Hip abduction    ?Hip adduction    ?Hip internal rotation    ?Hip external rotation    ?Knee flexion    ?Knee extension    ?Ankle dorsiflexion    ?Ankle plantarflexion    ?Ankle inversion    ?Ankle eversion    ? (Blank rows = not tested) ? ?LE MMT: ? ?MMT Right ?04/23/2021 Left ?04/23/2021  ?Hip flexion 4- 4+  ?Hip extension 4- 4+  ?Hip abduction 4- 4  ?Hip adduction    ?Hip internal rotation    ?Hip external  rotation    ?Knee flexion 4 4+  ?Knee extension 4 4+  ?Ankle dorsiflexion    ?Ankle plantarflexion    ?Ankle inversion    ?Ankle eversion    ? (Blank rows = not tested) ? ?LUMBAR SPECIAL TESTS:  ?NT ? ?FUNCTIONAL TESTS:  ?5 times sit to stand: 18.89 sec ?6 minute walk test: TBD ? ?GAIT: ?Distance walked: 150 to clinic ?Assistive device utilized:  None ?Level of assistance: Complete Independence ?Comments: Pt has equipment but no need Step through pattern  LLD R >L ? ?[facility] Adult PT Treatment:                                                DATE: 04-23-21 ?Therapeutic Exercise: ? Pt requiring much explanation and guided movement to perform correctly and decrease fear ?Prone Femoral Nerve Mobilization - use of strap and 10 x nerve flossing ?Sidelying Femoral Nerve Glide - Top Leg with use of strap or towel 10 x ?Sit to stand  3 x 10 with rests in between ?Prone Quadriceps Stretch with Strap  30 sec x 3 ?Supine Lower Trunk Rotation   5 x 20 sec on R and L ?Hooklying Single Knee to Chest Stretch  5 x R and L 10 sec each  ? ?Self Care: ?Pt needs much encouragement to utilize Med Go bridge app.  Pt was set up and taught how to utilize for exercise ?Educated on lifting verbal education and demo by PT about proper lifting  ? ?TODAY'S TREATMENT  ? ? ?04-21-21 Eval and initial HEP ? ? ?PATIENT EDUCATION:  ?Education details: POC, FOTO explanation, explanation of findings, inital HEP ?Person educated: Patient ?Education method: Explanation, Demonstration, Tactile cues, Verbal cues, and Handouts ?Education comprehension: verbalized understanding, returned demonstration, verbal cues required, tactile cues required, and needs further education ? ? ?HOME EXERCISE PROGRAM:   ? Access Code: [email or web address] ?URL: [email or web address] ?Date: 04/21/2021 ?Prepared by: Voncille Lo ? ?Program Notes ?Do sit to stand every day 3 x a day for 10 x before your eat breakfast, lunch and dinner ? ?Exercises ?- Prone Femoral Nerve Mobilization  - 1 x daily - 7 x weekly - 3 sets - 10 reps ?- Sidelying Femoral Nerve Glide - Top Leg  - 1 x daily - 7 x weekly - 3 sets - 10 reps ?- Sit to stand with sink support Movement snack  - 1 x daily - 7 x weekly - 3 sets - 10 reps ?- Sidelying Femoral Nerve Glide  - 1 x daily - 7 x weekly - 3 sets - 10 reps ? ? ?- Prone Quadriceps Stretch with  Strap  - 1 x daily - 7 x weekly - 1 sets - 3 reps - 30 sec hold ?- Supine Lower Trunk Rotation  - 1-2 x daily - 7 x weekly - 1 sets - 5 reps - 20sec hold ?- Hooklying Single Knee to Chest Stretch  - 1 x daily - 7

## 2021-04-23 ENCOUNTER — Ambulatory Visit: Payer: Medicare Other | Admitting: Physical Therapy

## 2021-04-23 DIAGNOSIS — M6281 Muscle weakness (generalized): Secondary | ICD-10-CM

## 2021-04-23 DIAGNOSIS — M545 Low back pain, unspecified: Secondary | ICD-10-CM

## 2021-04-23 DIAGNOSIS — R2689 Other abnormalities of gait and mobility: Secondary | ICD-10-CM | POA: Diagnosis not present

## 2021-04-23 DIAGNOSIS — M25551 Pain in right hip: Secondary | ICD-10-CM

## 2021-04-23 DIAGNOSIS — G8929 Other chronic pain: Secondary | ICD-10-CM | POA: Diagnosis not present

## 2021-04-23 DIAGNOSIS — M5416 Radiculopathy, lumbar region: Secondary | ICD-10-CM | POA: Diagnosis not present

## 2021-04-23 NOTE — Patient Instructions (Signed)
Sleeping on Back ? ?Place pillow under knees. A pillow with cervical support and a roll around waist are also helpful. ?Copyright ? [employer]. All rights reserved.  Sleeping on Side ?Place pillow between knees. Use cervical support under neck and a roll around waist as needed. ?Copyright ? VHI. All rights reserved.   ?Sleeping on Stomach ? ? ?If this is the only desirable sleeping position, place pillow under lower legs, and under stomach or chest as needed. ? ?Posture - Sitting ? ? ?Sit upright, head facing forward. Try using a roll to support lower back. Keep shoulders relaxed, and avoid rounded back. Keep hips level with knees. Avoid crossing legs for long periods. ?Stand to Sit / Sit to Stand ? ? ?To sit: Bend knees to lower self onto front edge of chair, then scoot back on seat. ?To stand: Reverse sequence by placing one foot forward, and scoot to front of seat. Use rocking motion to stand up.  ? ?Work Height and Reach ? ?Ideal work height is no more than 2 to 4 inches below elbow level when standing, and at elbow level when sitting. Reaching should be limited to arm's length, with elbows slightly bent. ? ?Bending ? ?Bend at hips and knees, not back. Keep feet shoulder-width apart. ? ? ? Posture - Standing ? ? ?Good posture is important. Avoid slouching and forward head thrust. Maintain curve in low back and align ears over shoul- ders, hips over ankles. ? ?Alternating Positions ? ? ?Alternate tasks and change positions frequently to reduce fatigue and muscle tension. Take rest breaks. ?Computer Work ? ? ?Position work to face forward. Use proper work and seat height. Keep shoulders back and down, wrists straight, and elbows at right angles. Use chair that provides full back support. Add footrest and lumbar roll as needed. ? ?Getting Into / Out of Car ? ?Lower self onto seat, scoot back, then bring in one leg at a time. Reverse sequence to get out. ? ?Dressing ? ?Lie on back to pull socks or slacks over feet, or sit  and bend leg while keeping back straight. ?  ? ?[place] ? ?Place one foot on ledge of cabinet under sink when standing at sink for prolonged periods. ? ? ?Pushing / Pulling ? ?Pushing is preferable to pulling. Keep back in proper alignment, and use leg muscles to do the work. ? ?Deep Squat ? ? ?Squat and lift with both arms held against upper trunk. Tighten stomach muscles without holding breath. ?Use smooth movements to avoid jerking.  ?Avoid Twisting ? ? ?Avoid twisting or bending back. Pivot around using foot movements, and bend at knees if needed when reaching for articles. ? ?Carrying Luggage ? ? ?Distribute weight evenly on both sides. Use a cart whenever possible. Do not twist trunk. Move body as a unit. ? ? ?Lifting Principles ?Maintain proper posture and head alignment. ?Slide object as close as possible before lifting. ?Move obstacles out of the way. ?Test before lifting; ask for help if too heavy. ?Tighten stomach muscles without holding breath. ?Use smooth movements; do not jerk. ?Use legs to do the work, and pivot with feet. ?Distribute the work load symmetrically and close to the center of trunk. ?Push instead of pull whenever possible.  ? ?Ask For Help ? ? ?Ask for help and delegate to others when possible. Coordinate your movements when lifting together, and maintain the low back curve. ? ?Log Roll ? ? ?Lying on back, bend left knee and place left  arm across chest. Roll all in one movement to the right. Reverse to roll to the left. Always move as one unit. ?[place] ? ?Use long-handled equipment to avoid stooping. ? ? ?[place] ? ?Position yourself as close as possible to reach work surface. Avoid straining your back. ? ?Laundry - Unloading Wash ? ? ?To unload small items at bottom of washer, lift leg opposite to arm being used to reach. ? ?Gardening - Raking ? ?Move close to area to be raked. Use arm movements to do the work. Keep back straight and avoid twisting. ? ?    ?Cart ? ?When reaching into cart with one arm, lift opposite leg to keep back straight. ? ? Getting Into / Out of Bed ? ?Lower self to lie down on one side by raising legs and lowering head at the same time. Use arms to assist moving without twisting. Bend both knees to roll onto back if desired. To sit up, start from lying on side, and use same move-ments in reverse. ?[place] ? ?Hold the vacuum with arm held at side. Step back and forth to move it, keeping head up. Avoid twisting. ? ? ?Laundry - Loading Wash ? ?Position laundry basket so that bending and twisting can be avoided. ? ? Laundry - Unloading Dryer ? ?Squat down to reach into clothes dryer or use a reacher. ? ?[place] / [place] ? ?Squat or Kneel. Knee pads may be helpful. ? ? ? ? ? ? ? ? ? ? ? ? ? ? ? ?  ?  ?Cheryl Hendricks, PT, [place] ?Certified Exercise Expert for the Aging Adult  ?04/23/21 10:14 AM ?Phone: [phone] ?Fax: [phone]  ? ?

## 2021-04-27 ENCOUNTER — Ambulatory Visit: Payer: Medicare Other | Admitting: Physical Therapy

## 2021-04-27 ENCOUNTER — Encounter: Payer: Self-pay | Admitting: Physical Therapy

## 2021-04-27 DIAGNOSIS — M6281 Muscle weakness (generalized): Secondary | ICD-10-CM

## 2021-04-27 DIAGNOSIS — G8929 Other chronic pain: Secondary | ICD-10-CM

## 2021-04-27 DIAGNOSIS — M25551 Pain in right hip: Secondary | ICD-10-CM | POA: Diagnosis not present

## 2021-04-27 DIAGNOSIS — R2689 Other abnormalities of gait and mobility: Secondary | ICD-10-CM

## 2021-04-27 DIAGNOSIS — M5416 Radiculopathy, lumbar region: Secondary | ICD-10-CM | POA: Diagnosis not present

## 2021-04-27 DIAGNOSIS — M545 Low back pain, unspecified: Secondary | ICD-10-CM | POA: Diagnosis not present

## 2021-04-27 NOTE — Therapy (Signed)
?OUTPATIENT PHYSICAL THERAPY THORACOLUMBAR EVALUATION ? ? ?Patient Name: Cheryl Hendricks ?MRN: [id number] ?DOB:[birth year], 71 y.o., female ?Today's Date: 04/27/2021 ? ? PT End of Session - 04/27/21 1117   ? ? Visit Number 3   ? Number of Visits 8   ? Date for PT Re-Evaluation 06/16/21   ? Authorization Type MCR A and B [employer]   ? Progress Note Due on Visit 10   ? PT Start Time 1114   ? PT Stop Time 1200   ? PT Time Calculation (min) 46 min   ? Activity Tolerance Patient tolerated treatment well   ? Behavior During Therapy [facility] for tasks assessed/performed   ? ?  ?  ? ?  ? ? ? ? ?Past Medical History:  ?Diagnosis Date  ? Adnexal mass   ? Arthritis   ? Colon polyps   ? Complication of anesthesia   ? waking up during ablation  ? Cyst of right ovary   ? Diabetes mellitus without complication ([place])   ? type 2  ? GERD (gastroesophageal reflux disease)   ? History of cervical dysplasia   ? History of hiatal hernia   ? HLD (hyperlipidemia)   ? HTN (hypertension)   ? Osteopenia   ? PVCs (premature ventricular contractions)   ? s/p cardiac ablation in 2011  ? ?Past Surgical History:  ?Procedure Laterality Date  ? APPENDECTOMY    ? at same time as her left ovarian cyst removal  ? CARDIAC ELECTROPHYSIOLOGY STUDY AND ABLATION    ? OVARIAN CYST REMOVAL  1973  ? TONSILLECTOMY    ? TOTAL HIP ARTHROPLASTY Right 02/15/2020  ? Procedure: RIGHT TOTAL HIP ARTHROPLASTY ANTERIOR APPROACH;  Surgeon: Mcarthur Rossetti, MD;  Location: [facility];  Service: Orthopedics;  Laterality: Right;  ? TUBAL LIGATION  1996  ? ?Patient Active Problem List  ? Diagnosis Date Noted  ? Status post total replacement of right hip 02/28/2020  ? History of cervical dysplasia 09/19/2019  ? Adnexal mass 09/19/2019  ? Unilateral primary osteoarthritis, right hip 08/15/2019  ? ? ?PCP: Wenda Low, MD ? ?REFERRING PROVIDER: Wenda Low, MD ? ?REFERRING DIAG: M54.16 (ICD-10-CM) - Radiculopathy, lumbar region ? ?THERAPY DIAG:  ?Chronic low back pain, unspecified back  pain laterality, unspecified whether sciatica present ? ?Muscle weakness (generalized) ? ?Other abnormalities of gait and mobility ? ?Pain in right hip ? ?ONSET DATE:  April 04, 2021 ? ?SUBJECTIVE:                                                                                                                                                                                          ? ?  SUBJECTIVE STATEMENT:  I am getting ready to go to [place]. I have the exercises I need . I just need to do them. ? ?EVAL- I was lifting boxes and I irritated my back  04-04-21 and I used improper body mechanics.  I presently have no pain but I still have weakness from my RTHA 02/15/20 and I was in so much pain the night I brought the books to the [employer] that I could barely move.  I have benefited from the medication I was given gabapentin and tramadol but I am no longer taking now.  I want a home program that I can use to prevent alleviate pain and get stronger  ? ?PERTINENT HISTORY:  ?Adnexal mass, DM, GERD, hiatial hernia, HTN, HLD, Ostepenia, PVCS, THA 02/15/20, tonsillectomy, Ovarian cyst removal, ablation ? ?PAIN:  ?Are you having pain? Yes: NPRS scale: 0/10 ?Pain location: running on R side gluts to anterior thigh to R ankle but no longer a problems ?Pain description: femoral nerve pain L-2/L-3 all anterior thigh pain ?Aggravating factors: lifting books and boxes initiated. And then sleeping at night in March 2023 ?Relieving factors: medication, icing before seing MD ? ? ?PRECAUTIONS: None ? ?WEIGHT BEARING RESTRICTIONS No ? ?FALLS:  ?Has patient fallen in last 6 months? No ? ?LIVING ENVIRONMENT: ?Lives with: lives alone ?Lives in: House/apartment ?Stairs: No ?Has following equipment at home: Single point cane and Walker - 2 wheeled ? 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 ?PLOF: Independent ? ?PATIENT GOALS I want to learn how I can decrease pain when my nerves are irritated.  ? ? ?OBJECTIVE:   ? ?DIAGNOSTIC FINDINGS:  ?Narrative ?04-07-21 ?2 views of the lumbar spine show no acute findings.  There is disc base  ?narrowing between L4 and L5.  The alignment is well-maintained.  ? ? ?PATIENT SURVEYS:  ?FOTO 94% ? ?SCREENING FOR RED FLAGS: ?Bowel or bladder incontinence: No ?Spinal tumors: No ?Cauda equina syndrome: No ?Compression fracture: No ?Abdominal aneurysm: No ? ?COGNITION: ? Overall cognitive status: Within functional limits for tasks assessed   ?  ?SENSATION: ?WFL ?Had incident in March that caused femoral nerve irritation ?MUSCLE LENGTH: ?Hamstrings: Right 60 deg; Left 65 deg ? ? ?POSTURE:  ?Pt with LLD R higher than left ?Had a lift in left shoe and has been using pre op. I went to [employer] and I have a 1/2 inch lift in Left shoe ?PALPATION: ?Minimal TTP over Low back, slight tightness over R Lumbar ? ?LUMBAR ROM:  ? ?Active  A/PROM  ?04/27/2021  ?Flexion Finger tip to ankle  ?Extension 75%  ?Right lateral flexion Fingertip to knee jt line  ?Left lateral flexion Fingertip to knee jt line  ?Right rotation WFL  ?Left rotation WFL  ? (Blank rows = not tested) ? ?LE ROM: ? ?Active  Right ?04/27/2021 Left ?04/27/2021  ?Hip flexion 95 100  ?Hip extension    ?Hip abduction    ?Hip adduction    ?Hip internal rotation    ?Hip external rotation    ?Knee flexion    ?Knee extension    ?Ankle dorsiflexion    ?Ankle plantarflexion    ?Ankle inversion    ?Ankle eversion    ? (Blank rows = not tested) ? ?LE MMT: ? ?MMT Right ?04/27/2021 Left ?04/27/2021  ?Hip flexion 4- 4+  ?Hip extension 4- 4+  ?Hip abduction 4- 4  ?Hip adduction    ?Hip internal rotation    ?Hip external rotation    ?  Knee flexion 4 4+  ?Knee extension 4 4+  ?Ankle dorsiflexion    ?Ankle plantarflexion    ?Ankle inversion    ?Ankle eversion    ? (Blank rows = not tested) ? ?LUMBAR SPECIAL TESTS:  ?NT ? ?FUNCTIONAL TESTS:  ?5 times sit to stand: 18.89 sec ?6 minute walk test: TBD ? ?GAIT: ?Distance walked: 150 to clinic ?Assistive device utilized:  None ?Level of assistance: Complete Independence ?Comments: Pt has equipment but no need Step through pattern  LLD R >L ? ?[facility] Adult PT Treatment:                                                DATE: 04-27-21 ? ?Therapeutic Exercise: Review HEP for use on trip ?Squat with 15 # 2 x 10 ?Wall sit 60 degrees 1 x 5 10 sec ?ITB and and QL stretch in door way 3 x 20 sec hold ?Prone Femoral Nerve Mobilization - use of strap and 10 x nerve flossing ?Sidelying Femoral Nerve Glide - Top Leg with use of strap or towel 10 x ?Sit to stand  3 x 10 with rests in between ?Prone Quadriceps Stretch with Strap  30 sec x 3 ?Supine Lower Trunk Rotation   5 x 20 sec on R and L ?Hooklying Single Knee to Chest Stretch  5 x R and L 10 sec each  ? ?Self Care: ?Went over ADL, posture and body mechanics with demo of lifting properly with 15 # KB ?Squat and lift of stool and KB 15 # ?Answered questions on how to address bike riding and use of helmet and hip flexion in order to ride in [place]  ? ?[facility] Adult PT Treatment:                                                DATE: 04-23-21 ?Therapeutic Exercise: ? Pt requiring much explanation and guided movement to perform correctly and decrease fear ?Prone Femoral Nerve Mobilization - use of strap and 10 x nerve flossing ?Sidelying Femoral Nerve Glide - Top Leg with use of strap or towel 10 x ?Sit to stand  3 x 10 with rests in between ?Prone Quadriceps Stretch with Strap  30 sec x 3 ?Supine Lower Trunk Rotation   5 x 20 sec on R and L ?Hooklying Single Knee to Chest Stretch  5 x R and L 10 sec each  ? ?Self Care: ?Pt needs much encouragement to utilize Med Go bridge app.  Pt was set up and taught how to utilize for exercise ?Educated on lifting verbal education and demo by PT about proper lifting  ? ?TODAY'S TREATMENT  ? ? ?04-21-21 Eval and initial HEP ? ? ?PATIENT EDUCATION:  ?Education details: POC, FOTO explanation, explanation of findings, inital HEP ?Person educated: Patient ?Education method:  Explanation, Demonstration, Tactile cues, Verbal cues, and Handouts ?Education comprehension: verbalized understanding, returned demonstration, verbal cues required, tactile cues required, and needs furthe

## 2021-04-30 IMAGING — MR MR HIP*R* W/O CM
7 series · 40 of 40 positions shown · non-contrast
Comparison: Right hip radiographs 03/30/2019.

CLINICAL DATA: Constant right hip pain and difficulty standing and
walking since falling in 7677. Evaluate for bursitis, tendonitis and
arthritis.

EXAM:
MR OF THE RIGHT HIP WITHOUT CONTRAST
TECHNIQUE: Multiplanar, multisequence MR imaging was performed. No intravenous
contrast was administered.

[Series 3: T1 · coronal · 4.0mm · 1.48mm/px · 8 of 43 slices shown]
[im 1/43]
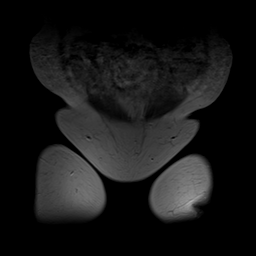
[im 7/43]
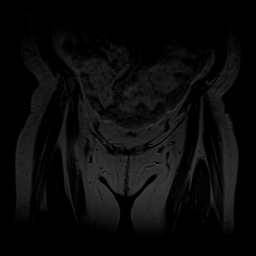
[im 13/43]
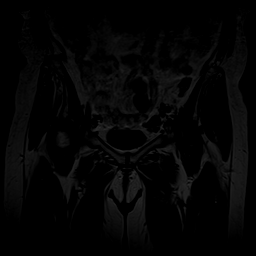
[im 19/43]
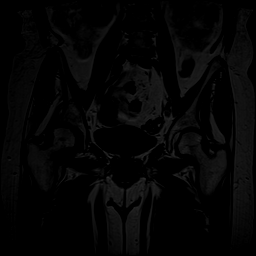
[im 25/43]
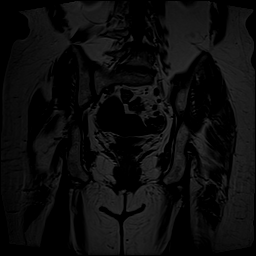
[im 31/43]
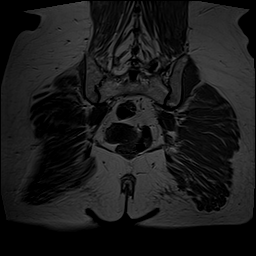
[im 37/43]
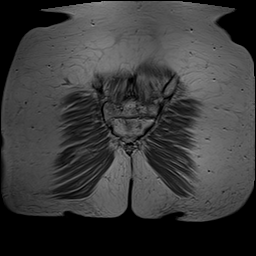
[im 43/43]
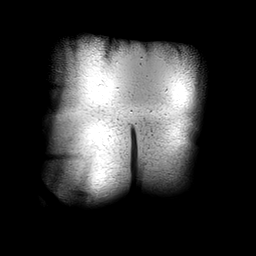

[Series 4: T2 fat-sat · coronal · 4.0mm · 1.98mm/px · 8 of 43 slices shown (1 of 4)]
[im 1/43]
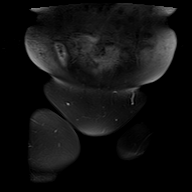
[im 7/43]
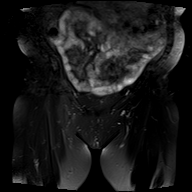
[im 13/43]
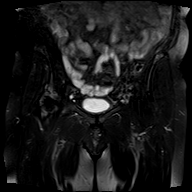
[im 19/43]
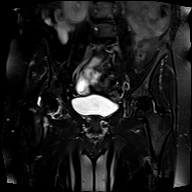
[im 25/43]
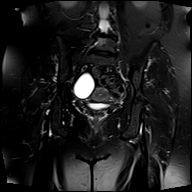
[im 31/43]
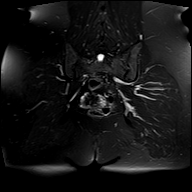
[im 37/43]
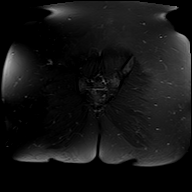
[im 43/43]
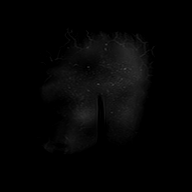

[Series 5: T2 fat-sat · axial · 4.0mm · 0.35mm/px · z∈[-121,+2]mm · 5 of 26 slices shown (2 of 4)]
[im 1/26]
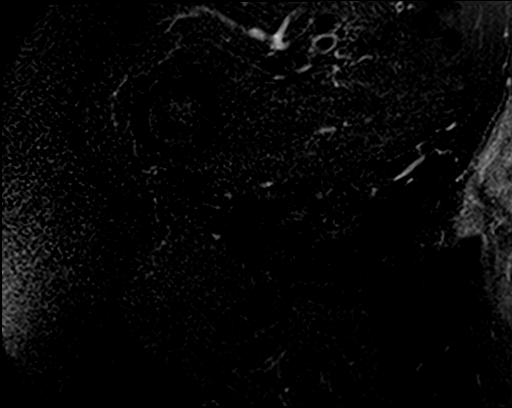
[im 7/26]
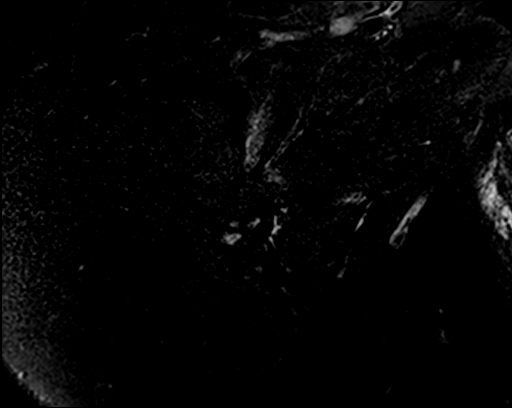
[im 13/26]
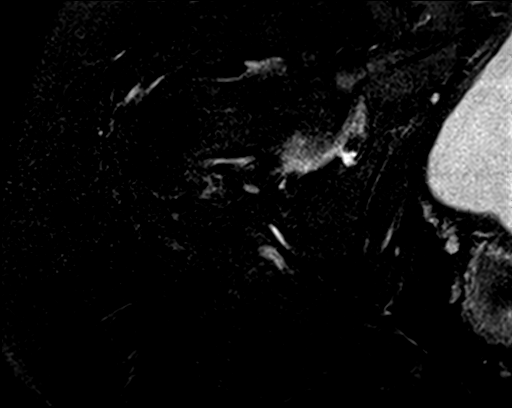
[im 19/26]
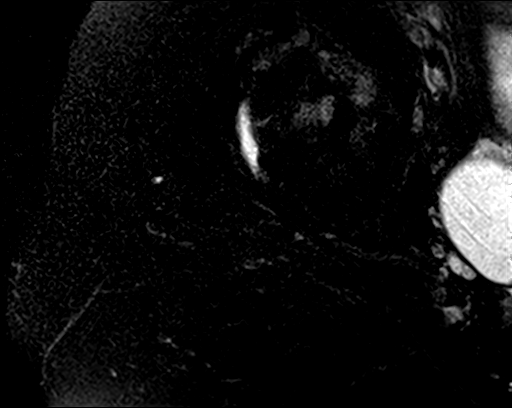
[im 26/26]
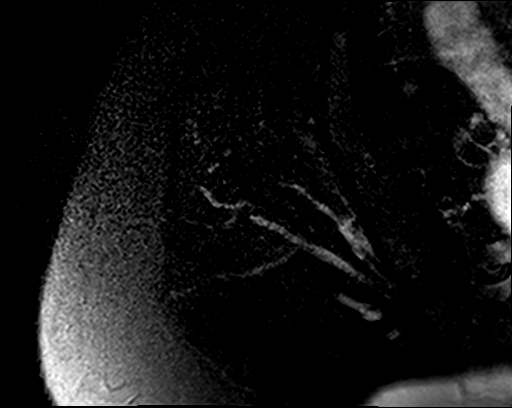

[Series 6: PD fat-sat · sagittal · 4.0mm · 0.70mm/px · 5 of 28 slices shown (1 of 2)]
[im 1/28]
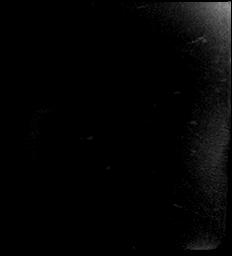
[im 7/28]
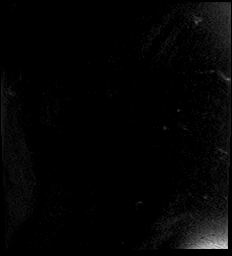
[im 14/28]
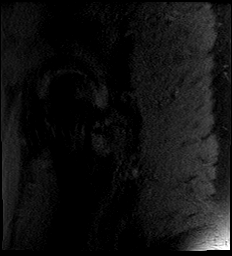
[im 21/28]
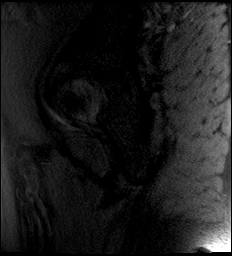
[im 28/28]
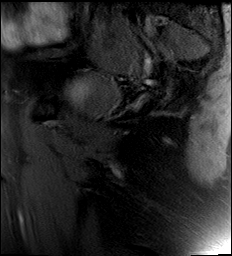

[Series 7: PD fat-sat · coronal · 4.0mm · 0.94mm/px · 4 of 23 slices shown (2 of 2)]
[im 1/23]
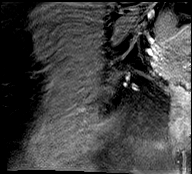
[im 8/23]
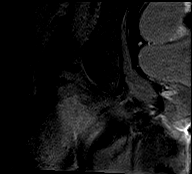
[im 15/23]
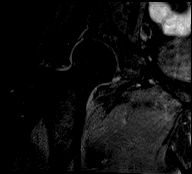
[im 23/23]
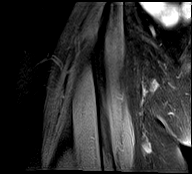

[Series 8: T2 fat-sat · axial · 4.0mm · 0.35mm/px · z∈[-121,+2]mm · 5 of 26 slices shown (3 of 4)]
[im 1/26]
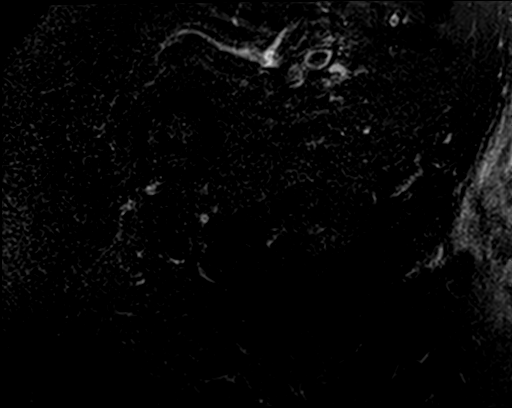
[im 7/26]
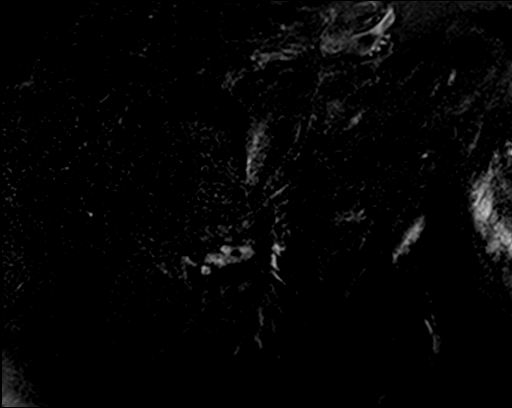
[im 13/26]
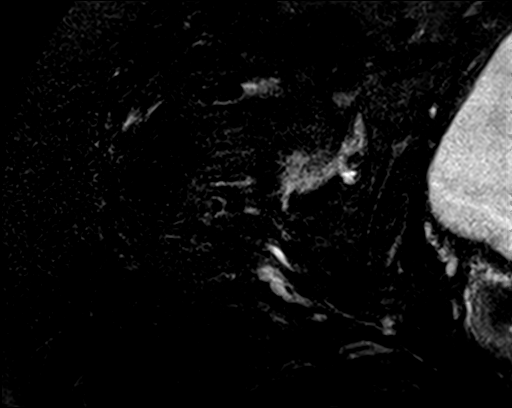
[im 19/26]
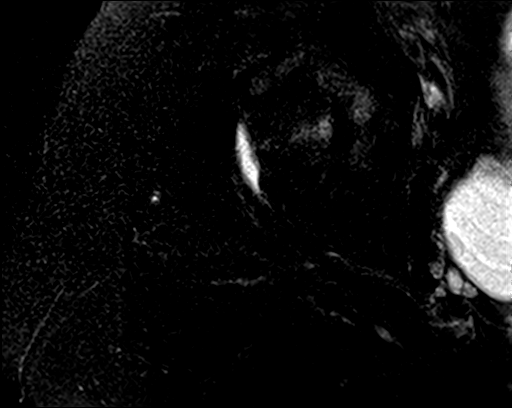
[im 26/26]
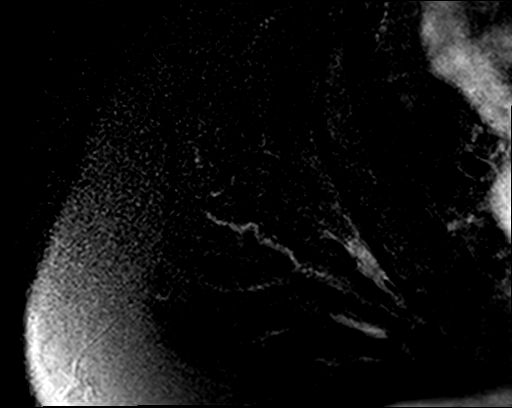

[Series 9: T2 fat-sat · axial · 4.0mm · 0.35mm/px · z∈[-121,+2]mm · 5 of 26 slices shown (4 of 4)]
[im 1/26]
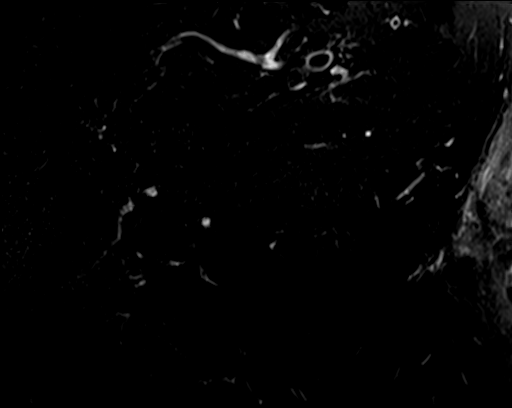
[im 7/26]
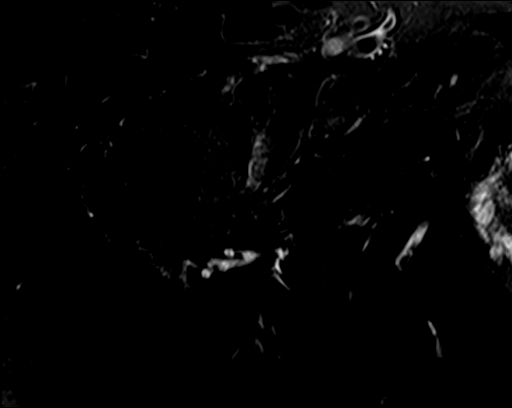
[im 13/26]
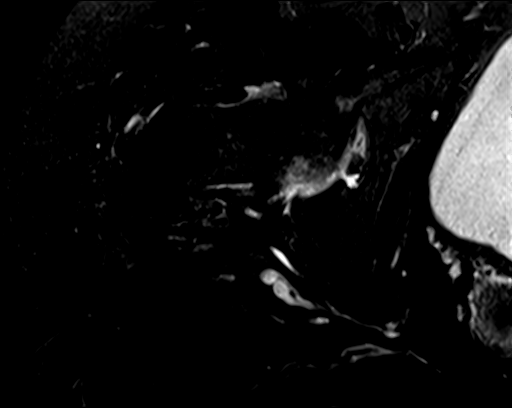
[im 19/26]
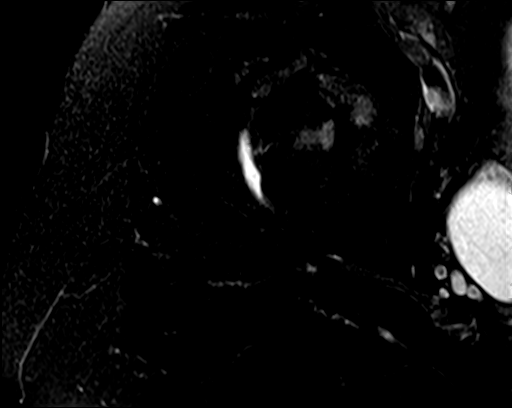
[im 26/26]
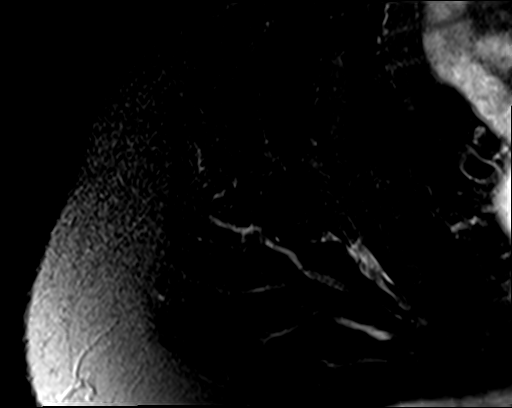

[40 of 40 positions shown; findings below may reference images not displayed]

FINDINGS: Despite efforts by the technologist and patient, moderate motion
artifact is present on today's exam and could not be eliminated.
This reduces exam sensitivity and specificity. Several sequences
were repeated.

Bones: There is no evidence of acute fracture, dislocation or
avascular necrosis. There is asymmetric right hip arthropathy with
subchondral edema in the right femoral head and acetabulum. There is
mild lower lumbar spondylosis. The visualized sacroiliac joints and
symphysis pubis appear normal.

Articular cartilage and labrum

Articular cartilage: Asymmetric right hip arthropathy with
asymmetric subchondral cyst formation anteriorly in the acetabulum.
There is subchondral edema without erosive change. No significant
left hip arthropathy.

Labrum: Labral assessment is limited by the motion. Probable
degenerative tearing of the right acetabular labrum anteriorly.

Joint or bursal effusion

Joint effusion: Small right hip joint effusion.

Bursae: No focal periarticular fluid collection.

Muscles and tendons

Muscles and tendons: Mild common hamstring tendinosis and partial
tearing bilaterally. The gluteus and visualized iliopsoas tendons
appear normal. The piriformis muscles are symmetric.

Other findings

Miscellaneous: There is a cystic mass in the right adnexa which is
fully imaged in the coronal plane, measuring 5.8 x 4.1 cm on image
[DATE]. This appears unilocular and without suspicious characteristics
on noncontrast imaging. The visualized internal pelvic contents
otherwise appear unremarkable.
IMPRESSION: 1. Asymmetric right hip arthropathy with subchondral cyst formation
and edema in the right femoral head and acetabulum and small right
hip joint effusion.
2. Suspected underlying degenerative tearing of the right acetabular
labrum anteriorly, suboptimally evaluated due to motion.
3. No acute osseous abnormality or significant left hip arthropathy.
4. Mild common hamstring tendinosis and partial tearing bilaterally.
5. Incompletely characterized 5.8 cm cystic mass in the right
adnexa, without suspicious characteristics on noncontrast imaging.
Prompt pelvic ultrasound recommended for further evaluation. This
recommendation follows ACR consensus guidelines: White Paper of the
ACR Incidental Findings Committee II on Adnexal Findings. [HOSPITAL] [DATE].

## 2021-05-14 NOTE — Therapy (Signed)
?OUTPATIENT PHYSICAL THERAPY THORACOLUMBAR EVALUATION ? ? ?Patient Name: Cheryl Hendricks ?MRN: [id number] ?DOB:1950/12/28, 71 y.o., female ?Today's Date: 05/19/2021 ? ? PT End of Session - 05/19/21 0857   ? ? Visit Number 4   ? Number of Visits 8   ? Date for PT Re-Evaluation 06/16/21   ? Authorization Type MCR A and B [employer]   ? PT Start Time [phone]   ? PT Stop Time 0930   ? PT Time Calculation (min) 36 min   ? Activity Tolerance Patient tolerated treatment well   ? Behavior During Therapy [facility] for tasks assessed/performed   ? ?  ?  ? ?  ? ? ? ? ? ?Past Medical History:  ?Diagnosis Date  ? Adnexal mass   ? Arthritis   ? Colon polyps   ? Complication of anesthesia   ? waking up during ablation  ? Cyst of right ovary   ? Diabetes mellitus without complication ([place])   ? type 2  ? GERD (gastroesophageal reflux disease)   ? History of cervical dysplasia   ? History of hiatal hernia   ? HLD (hyperlipidemia)   ? HTN (hypertension)   ? Osteopenia   ? PVCs (premature ventricular contractions)   ? s/p cardiac ablation in 2011  ? ?Past Surgical History:  ?Procedure Laterality Date  ? APPENDECTOMY    ? at same time as her left ovarian cyst removal  ? CARDIAC ELECTROPHYSIOLOGY STUDY AND ABLATION    ? OVARIAN CYST REMOVAL  1973  ? TONSILLECTOMY    ? TOTAL HIP ARTHROPLASTY Right 02/15/2020  ? Procedure: RIGHT TOTAL HIP ARTHROPLASTY ANTERIOR APPROACH;  Surgeon: Mcarthur Rossetti, MD;  Location: [facility];  Service: Orthopedics;  Laterality: Right;  ? TUBAL LIGATION  1996  ? ?Patient Active Problem List  ? Diagnosis Date Noted  ? Status post total replacement of right hip 02/28/2020  ? History of cervical dysplasia 09/19/2019  ? Adnexal mass 09/19/2019  ? Unilateral primary osteoarthritis, right hip 08/15/2019  ? ? ?PCP: Wenda Low, MD ? ?REFERRING PROVIDER: Mcarthur Rossetti* ? ?REFERRING DIAG: M54.16 (ICD-10-CM) - Radiculopathy, lumbar region ? ?THERAPY DIAG:  ?Chronic low back pain, unspecified back pain laterality,  unspecified whether sciatica present ? ?Muscle weakness (generalized) ? ?Other abnormalities of gait and mobility ? ?Pain in right hip ? ?ONSET DATE:  April 04, 2021 ? ?SUBJECTIVE:                                                                                                                                                                                          ? ?SUBJECTIVE STATEMENT: I did not really  do exercise in [place] but I did go up and down steps. My son thought I was walking better and faster ? ? ?EVAL- I was lifting boxes and I irritated my back  04-04-21 and I used improper body mechanics.  I presently have no pain but I still have weakness from my RTHA 02/15/20 and I was in so much pain the night I brought the books to the [employer] that I could barely move.  I have benefited from the medication I was given gabapentin and tramadol but I am no longer taking now.  I want a home program that I can use to prevent alleviate pain and get stronger  ? ?PERTINENT HISTORY:  ?Adnexal mass, DM, GERD, hiatial hernia, HTN, HLD, Ostepenia, PVCS, THA 02/15/20, tonsillectomy, Ovarian cyst removal, ablation ? ?PAIN:  ? ?Pain is  1/2 or 1/10 in R gluteals   ?Are you having pain? Yes: NPRS scale: 0/10 ?Pain location: running on R side gluts to anterior thigh to R ankle but no longer a problems ?Pain description: femoral nerve pain L-2/L-3 all anterior thigh pain ?Aggravating factors: lifting books and boxes initiated. And then sleeping at night in March 2023 ?Relieving factors: medication, icing before seing [place] ? ? ?PRECAUTIONS: None ? ?WEIGHT BEARING RESTRICTIONS No ? ?FALLS:  ?Has patient fallen in last 6 months? No ? ?LIVING ENVIRONMENT: ?Lives with: lives alone ?Lives in: House/apartment ?Stairs: No ?Has following equipment at home: Single point cane and Walker - 2 wheeled ? 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 ?PLOF: Independent ? ?PATIENT GOALS I want to learn how I can decrease pain  when my nerves are irritated.  ? ? ?OBJECTIVE:  ? ?DIAGNOSTIC FINDINGS:  ?Narrative ?04-07-21 ?2 views of the lumbar spine show no acute findings.  There is disc base  ?narrowing between L4 and L5.  The alignment is well-maintained.  ? ? ?PATIENT SURVEYS:  ?FOTO 94% ? ?SCREENING FOR RED FLAGS: ?Bowel or bladder incontinence: No ?Spinal tumors: No ?Cauda equina syndrome: No ?Compression fracture: No ?Abdominal aneurysm: No ? ?COGNITION: ? Overall cognitive status: Within functional limits for tasks assessed   ?  ?SENSATION: ?WFL ?Had incident in March that caused femoral nerve irritation ?MUSCLE LENGTH: ?Hamstrings: Right 60 deg; Left 65 deg ? ? ?POSTURE:  ?Pt with LLD R higher than left ?Had a lift in left shoe and has been using pre op. I went to [employer] and I have a 1/2 inch lift in Left shoe ?PALPATION: ?Minimal TTP over Low back, slight tightness over R Lumbar ? ?LUMBAR ROM:  ? ?Active  A/PROM  ?05/19/2021  ?Flexion Finger tip to ankle  ?Extension 75%  ?Right lateral flexion Fingertip to knee jt line  ?Left lateral flexion Fingertip to knee jt line  ?Right rotation WFL  ?Left rotation WFL  ? (Blank rows = not tested) ? ?LE ROM: ? ?Active  Right ?05/19/2021 Left ?05/19/2021  ?Hip flexion 95 100  ?Hip extension    ?Hip abduction    ?Hip adduction    ?Hip internal rotation    ?Hip external rotation    ?Knee flexion    ?Knee extension    ?Ankle dorsiflexion    ?Ankle plantarflexion    ?Ankle inversion    ?Ankle eversion    ? (Blank rows = not tested) ? ?LE MMT: ? ?MMT Right ?05/19/2021 Left ?05/19/2021  ?Hip flexion 4- 4+  ?Hip extension 4- 4+  ?Hip abduction 4- 4  ?Hip adduction    ?  Hip internal rotation    ?Hip external rotation    ?Knee flexion 4 4+  ?Knee extension 4 4+  ?Ankle dorsiflexion    ?Ankle plantarflexion    ?Ankle inversion    ?Ankle eversion    ? (Blank rows = not tested) ? ?LUMBAR SPECIAL TESTS:  ?NT ? ?FUNCTIONAL TESTS:  ?5 times sit to stand: 18.89 sec ?6 minute walk test: TBD ?  05-19-21 5 x STS  15.14 ?  05-19-21 6MWT  1615 ft Normal 1500 ft to 1974f ?GAIT: ?Distance walked: 150 to clinic ?Assistive device utilized: None ?Level of assistance: Complete Independence ?Comments: Pt has equipment but no need Step through pattern  LLD R >L ?[facility]                                                DATE: 05-19-21 ?05-19-21 5 x STS 15.14 ?6MWT  1615 ft Normal 1500 ft to 19811f?Therapeutic Exercise: ?Chair squat with 15 # KB 2 x 10 ?Hip Hinge with green powerband around hip, VC for correct upright posture. Pt tends to pitch torso forward  ?Cybex hip abd with 25 # 2 x 10 for R and L each ?Cybex hip ext with 25 # 2 x 10 for R and L each ?Leg press  2 plates 2 x 10 and 3 plates 1 x 10  Pt complains of bil thigh pain but explained about DOMS . Pt pain subsides with continued exercise ?Forward step ups on  R 2 x 10 on 8 inch step and UE support bil ?Hip Hinge in tall kneeling with power band x 15 ? ?[facility]                                                [facility]: [facility] ? ?Therapeutic Exercise: Review HEP for use on trip ?Squat with 15 # 2 x 10 ?Wall sit 60 degrees 1 x 5 10 sec ?ITB and and QL stretch in door way 3 x 20 sec hold ?Prone Femoral Nerve Mobilization - use of strap and 10 x nerve flossing ?Sidelying Femoral Nerve Glide - Top Leg with use of strap or towel 10 x ?Sit to stand  3 x 10 with rests in between ?Prone Quadriceps Stretch with Strap  30 sec x 3 ?Supine Lower Trunk Rotation   5 x 20 sec on R and L ?Hooklying Single Knee to Chest Stretch  5 x R and L 10 sec each  ? ?Self Care: ?Went over ADL, posture and body mechanics with demo of lifting properly with 15 # KB ?Squat and lift of stool and KB 15 # ?Answered questions on how to address bike riding and use of [place] and [place]? ?[facility]                                                [facility]: [facility] ?Therapeutic Exercise: ? Pt requiring much explanation and guided movement to perform  correctly and decrease fear ?Prone Femoral Nerve Mobilization -  use of strap and 10 x nerve flossing ?Sidelying Femoral Nerve Glide - Top Leg with use of strap or towel 10 x ?Sit to stand  3 x 10 with rests in between ?Pron

## 2021-05-19 ENCOUNTER — Encounter: Payer: Self-pay | Admitting: Physical Therapy

## 2021-05-19 ENCOUNTER — Ambulatory Visit: Payer: Medicare Other | Attending: Orthopaedic Surgery | Admitting: Physical Therapy

## 2021-05-19 DIAGNOSIS — G8929 Other chronic pain: Secondary | ICD-10-CM | POA: Insufficient documentation

## 2021-05-19 DIAGNOSIS — M545 Low back pain, unspecified: Secondary | ICD-10-CM | POA: Insufficient documentation

## 2021-05-19 DIAGNOSIS — R2689 Other abnormalities of gait and mobility: Secondary | ICD-10-CM | POA: Insufficient documentation

## 2021-05-19 DIAGNOSIS — M6281 Muscle weakness (generalized): Secondary | ICD-10-CM | POA: Insufficient documentation

## 2021-05-19 DIAGNOSIS — M25551 Pain in right hip: Secondary | ICD-10-CM | POA: Insufficient documentation

## 2021-05-21 ENCOUNTER — Ambulatory Visit: Payer: Medicare Other | Admitting: Physical Therapy

## 2021-05-23 ENCOUNTER — Ambulatory Visit: Payer: Medicare Other | Admitting: Rehabilitative and Restorative Service Providers"

## 2021-05-23 ENCOUNTER — Encounter: Payer: Self-pay | Admitting: Rehabilitative and Restorative Service Providers"

## 2021-05-23 DIAGNOSIS — R2689 Other abnormalities of gait and mobility: Secondary | ICD-10-CM

## 2021-05-23 DIAGNOSIS — M25551 Pain in right hip: Secondary | ICD-10-CM

## 2021-05-23 DIAGNOSIS — M545 Low back pain, unspecified: Secondary | ICD-10-CM | POA: Diagnosis not present

## 2021-05-23 DIAGNOSIS — G8929 Other chronic pain: Secondary | ICD-10-CM | POA: Diagnosis not present

## 2021-05-23 DIAGNOSIS — M6281 Muscle weakness (generalized): Secondary | ICD-10-CM

## 2021-05-23 NOTE — Therapy (Signed)
?OUTPATIENT PHYSICAL THERAPY THORACOLUMBAR EVALUATION ? ? ?Patient Name: Cheryl Hendricks ?MRN: [id number] ?DOB:1950/11/09, 71 y.o., female ?Today's Date: 05/23/2021 ? ? PT End of Session - 05/23/21 1214   ? ? Visit Number 5   ? Number of Visits 8   ? Date for PT Re-Evaluation 06/16/21   ? Authorization Type MCR A and B [employer]   ? Progress Note Due on Visit 10   ? PT Start Time 1118   ? PT Stop Time 8466   ? PT Time Calculation (min) 46 min   ? Activity Tolerance Patient tolerated treatment well;No increased pain   ? Behavior During Therapy [facility] for tasks assessed/performed   ? ?  ?  ? ?  ? ? ? ? ? ? ?Past Medical History:  ?Diagnosis Date  ? Adnexal mass   ? Arthritis   ? Colon polyps   ? Complication of anesthesia   ? waking up during ablation  ? Cyst of right ovary   ? Diabetes mellitus without complication ([place])   ? type 2  ? GERD (gastroesophageal reflux disease)   ? History of cervical dysplasia   ? History of hiatal hernia   ? HLD (hyperlipidemia)   ? HTN (hypertension)   ? Osteopenia   ? PVCs (premature ventricular contractions)   ? s/p cardiac ablation in 2011  ? ?Past Surgical History:  ?Procedure Laterality Date  ? APPENDECTOMY    ? at same time as her left ovarian cyst removal  ? CARDIAC ELECTROPHYSIOLOGY STUDY AND ABLATION    ? OVARIAN CYST REMOVAL  1973  ? TONSILLECTOMY    ? TOTAL HIP ARTHROPLASTY Right 02/15/2020  ? Procedure: RIGHT TOTAL HIP ARTHROPLASTY ANTERIOR APPROACH;  Surgeon: Mcarthur Rossetti, MD;  Location: [facility];  Service: Orthopedics;  Laterality: Right;  ? TUBAL LIGATION  1996  ? ?Patient Active Problem List  ? Diagnosis Date Noted  ? Status post total replacement of right hip 02/28/2020  ? History of cervical dysplasia 09/19/2019  ? Adnexal mass 09/19/2019  ? Unilateral primary osteoarthritis, right hip 08/15/2019  ? ? ?PCP: Wenda Low, MD ? ?REFERRING PROVIDER: Mcarthur Rossetti* ? ?REFERRING DIAG: M54.16 (ICD-10-CM) - Radiculopathy, lumbar region ? ?THERAPY DIAG:  ?Chronic low  back pain, unspecified back pain laterality, unspecified whether sciatica present ? ?Muscle weakness (generalized) ? ?Other abnormalities of gait and mobility ? ?Pain in right hip ? ?ONSET DATE:  April 04, 2021 ? ?SUBJECTIVE:                                                                                                                                                                                          ? ?  SUBJECTIVE STATEMENT: I still haven't done my exercises. Next visit will be my last.  ? ? ?EVAL- I was lifting boxes and I irritated my back  04-04-21 and I used improper body mechanics.  I presently have no pain but I still have weakness from my RTHA 02/15/20 and I was in so much pain the night I brought the books to the [employer] that I could barely move.  I have benefited from the medication I was given gabapentin and tramadol but I am no longer taking now.  I want a home program that I can use to prevent alleviate pain and get stronger  ? ?PERTINENT HISTORY:  ?Adnexal mass, DM, GERD, hiatial hernia, HTN, HLD, Ostepenia, PVCS, THA 02/15/20, tonsillectomy, Ovarian cyst removal, ablation ? ?PAIN:  ? ?Pain is 0/10 ?Are you having pain? Yes: NPRS scale: 0/10 ?Pain location: running on R side gluts to anterior thigh to R ankle but no longer a problems ?Pain description: femoral nerve pain L-2/L-3 all anterior thigh pain ?Aggravating factors: lifting books and boxes initiated. And then sleeping at night in March 2023 ?Relieving factors: medication, icing before seing MD ? ? ?PRECAUTIONS: None ? ?WEIGHT BEARING RESTRICTIONS No ? ?FALLS:  ?Has patient fallen in last 6 months? No ? ?LIVING ENVIRONMENT: ?Lives with: lives alone ?Lives in: House/apartment ?Stairs: No ?Has following equipment at home: Single point cane and Walker - 2 wheeled ? 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 ?PLOF: Independent ? ?PATIENT GOALS I want to learn how I can decrease pain when my nerves are irritated.   ? ? ?OBJECTIVE:  ? ?DIAGNOSTIC FINDINGS:  ?Narrative ?04-07-21 ?2 views of the lumbar spine show no acute findings.  There is disc base  ?narrowing between L4 and L5.  The alignment is well-maintained.  ? ? ?PATIENT SURVEYS:  ?FOTO 94% ? ?SCREENING FOR RED FLAGS: ?Bowel or bladder incontinence: No ?Spinal tumors: No ?Cauda equina syndrome: No ?Compression fracture: No ?Abdominal aneurysm: No ? ?COGNITION: ? Overall cognitive status: Within functional limits for tasks assessed   ?  ?SENSATION: ?WFL ?Had incident in March that caused femoral nerve irritation ?MUSCLE LENGTH: ?Hamstrings: Right 60 deg; Left 65 deg ? ? ?POSTURE:  ?Pt with LLD R higher than left ?Had a lift in left shoe and has been using pre op. I went to [employer] and I have a 1/2 inch lift in Left shoe ?PALPATION: ?Minimal TTP over Low back, slight tightness over R Lumbar ? ?LUMBAR ROM:  ? ?Active  A/PROM  ?05/23/2021  ?Flexion Finger tip to ankle  ?Extension 75%  ?Right lateral flexion Fingertip to knee jt line  ?Left lateral flexion Fingertip to knee jt line  ?Right rotation WFL  ?Left rotation WFL  ? (Blank rows = not tested) ? ?LE ROM: ? ?Active  Right ?05/23/2021 Left ?05/23/2021  ?Hip flexion 95 100  ?Hip extension    ?Hip abduction    ?Hip adduction    ?Hip internal rotation    ?Hip external rotation    ?Knee flexion    ?Knee extension    ?Ankle dorsiflexion    ?Ankle plantarflexion    ?Ankle inversion    ?Ankle eversion    ? (Blank rows = not tested) ? ?LE MMT: ? ?MMT Right ?05/23/2021 Left ?05/23/2021  ?Hip flexion 4- 4+  ?Hip extension 4- 4+  ?Hip abduction 4- 4  ?Hip adduction    ?Hip internal rotation    ?Hip external rotation    ?Knee  flexion 4 4+  ?Knee extension 4 4+  ?Ankle dorsiflexion    ?Ankle plantarflexion    ?Ankle inversion    ?Ankle eversion    ? (Blank rows = not tested) ? ?LUMBAR SPECIAL TESTS:  ?NT ? ?FUNCTIONAL TESTS:  ?5 times sit to stand: 18.89 sec ?6 minute walk test: TBD ?  05-19-21 5 x STS 15.14 ?  05-19-21 6MWT  1615 ft Normal  1500 ft to 1956f ?GAIT: ?Distance walked: 150 to clinic ?Assistive device utilized: None ?Level of assistance: Complete Independence ?Comments: Pt has equipment but no need Step through pattern  LLD R >L ? [facility]                                                [facility]: 05/23/21 ?Therapeutic Exercise: ?Elliptical level 1 ramp 3 x 4 min  ?Freemotion 13 lbs unilateral and then bil row x 20 ?Plank AROM from elbows x 1 sec hold x 20 reps ?Iso squat hold x 15 with 10 sec hold ?Leg Press 25 lbs x 20 WBOS/NBOS  ?8 in frontal step ups x 20 each LE ?Cybex hip abduction 2 plates x 20 R ?Cybex hip adduction 2.5 plates x 256D?Reviewed banded HEP ? ? ?[facility] ThompsonAdult PT Treatment:                                                DATE: 05-19-21 ?05-19-21 5 x STS 15.14 ?6MWT  1615 ft Normal 1500 ft to 19870f?Therapeutic Exercise: ?Chair squat with 15 # KB 2 x 10 ?Hip Hinge with green powerband around hip, VC for correct upright posture. Pt tends to pitch torso forward  ?Cybex hip abd with 25 # 2 x 10 for R and L each ?Cybex hip ext with 25 # 2 x 10 for R and L each ?Leg press  2 plates 2 x 10 and 3 plates 1 x 10  Pt complains of bil thigh pain but explained about DOMS . Pt pain subsides with continued exercise ?Forward step ups on  R 2 x 10 on 8 inch step and UE support bil ?Hip Hinge in tall kneeling with power band x 15 ? ?[facility]                                                [facility]: 04-27-21 ? ?Therapeutic Exercise: Review HEP for use on trip ?Squat with 15 # 2 x 10 ?Wall sit 60 degrees 1 x 5 10 sec ?ITB and and QL stretch in door way 3 x 20 sec hold ?Prone Femoral Nerve Mobilization - use of strap and 10 x nerve flossing ?Sidelying Femoral Nerve Glide - Top Leg with use of strap or towel 10 x ?Sit to stand  3 x 10 with rests in between ?Prone Quadriceps Stretch with Strap  30 sec x 3 ?Supine Lower Trunk Rotation   5 x 20 sec on R and L ?Hooklying Single Knee to Chest Stretch  5 x R and L 10 sec each  ? ?Self  Care: ?Went over ADL,  posture and body mechanics with demo of lifting properly with 15 # KB ?Squat and lift of stool and KB 15 # ?Answered questions on how to address bike riding and use of helmet and

## 2021-05-26 ENCOUNTER — Ambulatory Visit: Payer: Medicare Other | Admitting: Physical Therapy

## 2021-05-26 DIAGNOSIS — R2689 Other abnormalities of gait and mobility: Secondary | ICD-10-CM | POA: Diagnosis not present

## 2021-05-26 DIAGNOSIS — M545 Low back pain, unspecified: Secondary | ICD-10-CM | POA: Diagnosis not present

## 2021-05-26 DIAGNOSIS — M25551 Pain in right hip: Secondary | ICD-10-CM

## 2021-05-26 DIAGNOSIS — M6281 Muscle weakness (generalized): Secondary | ICD-10-CM | POA: Diagnosis not present

## 2021-05-26 DIAGNOSIS — G8929 Other chronic pain: Secondary | ICD-10-CM | POA: Diagnosis not present

## 2021-05-26 NOTE — Therapy (Signed)
?OUTPATIENT PHYSICAL THERAPY THORACOLUMBAR EVALUATION ?PHYSICAL THERAPY DISCHARGE SUMMARY ? ?Visits from Start of Care: 6 ? ?Current functional level related to goals / functional outcomes: ?As listed below ?  ?Remaining deficits: ?Pt has HEP to continue strengthening with walking and daily exercise ?  ?Education / Equipment: ?HEP  ? ?Patient agrees to discharge. Patient goals were met. Patient is being discharged due to meeting the stated rehab goals.  And being pleased with current functional level ? ?Patient Name: Cheryl Hendricks ?MRN: [id number] ?DOB:09/20/1950, 71 y.o., female ?Today's Date: 05/26/2021 ? ? PT End of Session - 05/26/21 1046   ? ? Visit Number 6   ? Number of Visits 8   ? Date for PT Re-Evaluation 06/16/21   ? Authorization Type MCR A and B [employer]   ? Progress Note Due on Visit 10   ? PT Start Time 1030   ? PT Stop Time 1116   ? PT Time Calculation (min) 46 min   ? Activity Tolerance Patient tolerated treatment well;No increased pain   ? ?  ?  ? ?  ? ? ? ? ? ? ? ?Past Medical History:  ?Diagnosis Date  ? Adnexal mass   ? Arthritis   ? Colon polyps   ? Complication of anesthesia   ? waking up during ablation  ? Cyst of right ovary   ? Diabetes mellitus without complication ([place])   ? type 2  ? GERD (gastroesophageal reflux disease)   ? History of cervical dysplasia   ? History of hiatal hernia   ? HLD (hyperlipidemia)   ? HTN (hypertension)   ? Osteopenia   ? PVCs (premature ventricular contractions)   ? s/p cardiac ablation in 2011  ? ?Past Surgical History:  ?Procedure Laterality Date  ? APPENDECTOMY    ? at same time as her left ovarian cyst removal  ? CARDIAC ELECTROPHYSIOLOGY STUDY AND ABLATION    ? OVARIAN CYST REMOVAL  1973  ? TONSILLECTOMY    ? TOTAL HIP ARTHROPLASTY Right 02/15/2020  ? Procedure: RIGHT TOTAL HIP ARTHROPLASTY ANTERIOR APPROACH;  Surgeon: Mcarthur Rossetti, MD;  Location: [facility];  Service: Orthopedics;  Laterality: Right;  ? TUBAL LIGATION  1996  ? ?Patient Active Problem  List  ? Diagnosis Date Noted  ? Status post total replacement of right hip 02/28/2020  ? History of cervical dysplasia 09/19/2019  ? Adnexal mass 09/19/2019  ? Unilateral primary osteoarthritis, right hip 08/15/2019  ? ? ?PCP: Wenda Low, MD ? ?REFERRING PROVIDER: Mcarthur Rossetti* ? ?REFERRING DIAG: M54.16 (ICD-10-CM) - Radiculopathy, lumbar region ? ?THERAPY DIAG:  ?Chronic low back pain, unspecified back pain laterality, unspecified whether sciatica present ? ?Pain in right hip ? ?Muscle weakness (generalized) ? ?Other abnormalities of gait and mobility ? ?ONSET DATE:  April 04, 2021 ? ?SUBJECTIVE:                                                                                                                                                                                          ? ?  SUBJECTIVE STATEMENT:  I have been doing exercises at home but not as consistently as I need to be doing them. I am working at [employer] in [employer] but I am looking for a new job.  My hip feels so much better and I am ready to DC.  I just have to do my exercises. ? ? ?EVAL- I was lifting boxes and I irritated my back  04-04-21 and I used improper body mechanics.  I presently have no pain but I still have weakness from my RTHA 02/15/20 and I was in so much pain the night I brought the books to the [employer] that I could barely move.  I have benefited from the medication I was given gabapentin and tramadol but I am no longer taking now.  I want a home program that I can use to prevent alleviate pain and get stronger  ? ?PERTINENT HISTORY:  ?Adnexal mass, DM, GERD, hiatial hernia, HTN, HLD, Ostepenia, PVCS, THA 02/15/20, tonsillectomy, Ovarian cyst removal, ablation ? ?PAIN:  ? ?Pain is first reported 1/2 or 1/10 but quickly is 0/10 with exercise ?Are you having pain? Yes: NPRS scale: 0/10 ?Pain location: running on R side gluts to anterior thigh to R ankle but no longer a problems ?Pain description: femoral nerve pain  L-2/L-3 all anterior thigh pain ?Aggravating factors: lifting books and boxes initiated. And then sleeping at night in March 2023 ?Relieving factors: medication, icing before seing MD ? ? ?PRECAUTIONS: None ? ?WEIGHT BEARING RESTRICTIONS No ? ?FALLS:  ?Has patient fallen in last 6 months? No ? ?LIVING ENVIRONMENT: ?Lives with: lives alone ?Lives in: House/apartment ?Stairs: No ?Has following equipment at home: Single point cane and Walker - 2 wheeled ? 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 ?PLOF: Independent ? ?PATIENT GOALS I want to learn how I can decrease pain when my nerves are irritated.  ? ? ?OBJECTIVE:  ? ?DIAGNOSTIC FINDINGS:  ?Narrative ?04-07-21 ?2 views of the lumbar spine show no acute findings.  There is disc base  ?narrowing between L4 and L5.  The alignment is well-maintained.  ? ? ?PATIENT SURVEYS:  ?FOTO 94% ?05-26-21 94% ? ?SCREENING FOR RED FLAGS: ?Bowel or bladder incontinence: No ?Spinal tumors: No ?Cauda equina syndrome: No ?Compression fracture: No ?Abdominal aneurysm: No ? ?COGNITION: ? Overall cognitive status: Within functional limits for tasks assessed   ?  ?SENSATION: ?WFL ?Had incident in March that caused femoral nerve irritation ?MUSCLE LENGTH: ?Hamstrings: Right 60 deg; Left 65 deg ? ? ?POSTURE:  ?Pt with LLD R higher than left ?Had a lift in left shoe and has been using pre op. I went to [employer] and I have a 1/2 inch lift in Left shoe ?PALPATION: ?Minimal TTP over Low back, slight tightness over R Lumbar ? ?LUMBAR ROM:  ? ?Active  A/PROM  ?05/26/2021  ?Flexion Finger tip to ankle  ?Extension 75%  ?Right lateral flexion Fingertip to knee jt line  ?Left lateral flexion Fingertip to knee jt line  ?Right rotation WFL  ?Left rotation WFL  ? (Blank rows = not tested) ? ?LE ROM: ? ?Active  Right ?EVAL Left ?Eval RIGHT LEFT  ?Hip flexion 95 100 104 112  ?Hip extension      ?Hip abduction      ?Hip adduction      ?Hip internal rotation      ?Hip external  rotation      ?Knee flexion      ?Knee extension      ?  Ankle dorsiflexion      ?Ankle plantarflexion      ?Ankle inversion      ?Ankle eversion      ? (Blank rows = not tested) ? ?LE MMT: ? ?MMT Right ?eval Left ?eval RIGHT ?05-26-21 LEFT ?05-26-21  ?Hip flexion 4- 4+ 5 5  ?Hip extension 4- 4+ 4+ 5  ?Hip abduction 4- 4 4 4+  ?Hip adduction      ?Hip internal rotation      ?Hip external rotation      ?Knee flexion 4 4+ 5 5  ?Knee extension 4 4+ 5 5  ?Ankle dorsiflexion      ?Ankle plantarflexion      ?Ankle inversion      ?Ankle eversion      ? (Blank rows = not tested) ? ?LUMBAR SPECIAL TESTS:  ?NT ? ?FUNCTIONAL TESTS:  ?5 times sit to stand: 18.89 sec ?6 minute walk test: TBD ?  05-19-21 5 x STS 15.14 ?  05-19-21 6MWT  1615 ft Normal 1500 ft to 1958f ?  05-26-21 5 x STS  11.49 sec ?GAIT: ?Distance walked: 150 to clinic ?Assistive device utilized: None ?Level of assistance: Complete Independence ?Comments: Pt has equipment but no need Step through pattern  LLD R >L ? ?[facility]                                                DATE: 05-26-21 ?Therapeutic Exercise: ?Mat squat with 15 # KB 2 x 10 ?Iso squat hold x 15 with 10 sec hold ?Stretch out Strap with prone knee flexion on right L 8 inch from heel to buttock and R 14 inch from heel to buttock ?Plank AROM from elbows reviewed form ?Reviwed HEP for home use and added plank and forward step up ?Self Care: ?Placed heel lift in left shoe complete heel lift instead of 1/2  ?Taught contract relax  technique to gain knee flexion in prone ?Reviewed principles of lifting  and hip hinge with return demo and precautions for protecting back ?DOMS and community resources for continued exercise with personal trainers to increase challenge as current HEP may become easy in a month or 2 ? ?[facility]                                                [facility]: 05/[facility]/21 ?Therapeutic Exercise: ?Elliptical level 1 ramp 3 x 4 min  ?Freemotion 13 lbs unilateral and then bil  row x 20 ?Plank AROM from elbows x 1 sec hold x 20 reps ?Iso squat hold x 15 with 10 sec hold ?Leg Press 25 lbs x 20 WBOS/NBOS  ?8 in frontal step ups x 20 each LE ?Cybex hip abduction 2 plates x 20 R ?Cybex hi

## 2021-06-01 ENCOUNTER — Other Ambulatory Visit: Payer: Self-pay | Admitting: Internal Medicine

## 2021-06-01 DIAGNOSIS — Z1231 Encounter for screening mammogram for malignant neoplasm of breast: Secondary | ICD-10-CM

## 2021-07-08 ENCOUNTER — Ambulatory Visit
Admission: RE | Admit: 2021-07-08 | Discharge: 2021-07-08 | Disposition: A | Discharge status: Home / Self Care | Payer: Medicare Other | Source: Ambulatory Visit | Attending: Internal Medicine | Admitting: Internal Medicine

## 2021-07-08 DIAGNOSIS — Z1231 Encounter for screening mammogram for malignant neoplasm of breast: Secondary | ICD-10-CM | POA: Diagnosis not present

## 2021-08-11 DIAGNOSIS — N83201 Unspecified ovarian cyst, right side: Secondary | ICD-10-CM | POA: Diagnosis not present

## 2021-08-11 DIAGNOSIS — I10 Essential (primary) hypertension: Secondary | ICD-10-CM | POA: Diagnosis not present

## 2021-08-11 DIAGNOSIS — Z0000 Encounter for general adult medical examination without abnormal findings: Secondary | ICD-10-CM | POA: Diagnosis not present

## 2021-08-11 DIAGNOSIS — Z1389 Encounter for screening for other disorder: Secondary | ICD-10-CM | POA: Diagnosis not present

## 2021-08-11 DIAGNOSIS — N9489 Other specified conditions associated with female genital organs and menstrual cycle: Secondary | ICD-10-CM | POA: Diagnosis not present

## 2021-08-11 DIAGNOSIS — M8580 Other specified disorders of bone density and structure, unspecified site: Secondary | ICD-10-CM | POA: Diagnosis not present

## 2021-08-11 DIAGNOSIS — E1169 Type 2 diabetes mellitus with other specified complication: Secondary | ICD-10-CM | POA: Diagnosis not present

## 2021-08-11 DIAGNOSIS — K219 Gastro-esophageal reflux disease without esophagitis: Secondary | ICD-10-CM | POA: Diagnosis not present

## 2021-08-11 DIAGNOSIS — E7800 Pure hypercholesterolemia, unspecified: Secondary | ICD-10-CM | POA: Diagnosis not present

## 2021-08-11 DIAGNOSIS — I493 Ventricular premature depolarization: Secondary | ICD-10-CM | POA: Diagnosis not present

## 2021-08-15 IMAGING — US US PELVIS COMPLETE WITH TRANSVAGINAL
1 series · 13 of 25 positions shown · non-contrast
Comparison: 08/23/2019

CLINICAL DATA: Adnexal mass on RIGHT



[Series 1: us pelvis complete with transvaginal · 40 acquisitions, 13 frames shown]
[im 1/40]
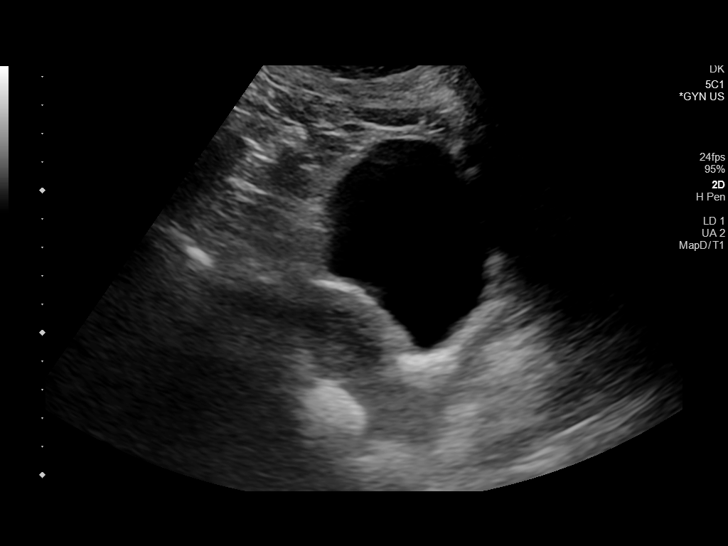
[im 4/40]
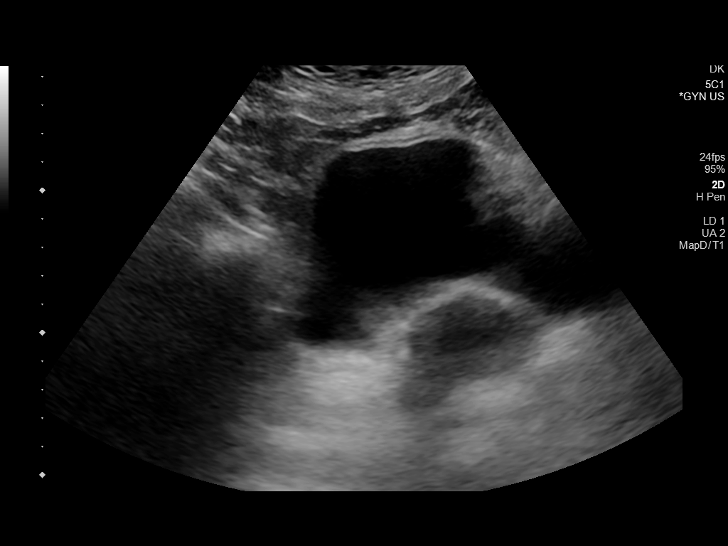
[im 7/40]
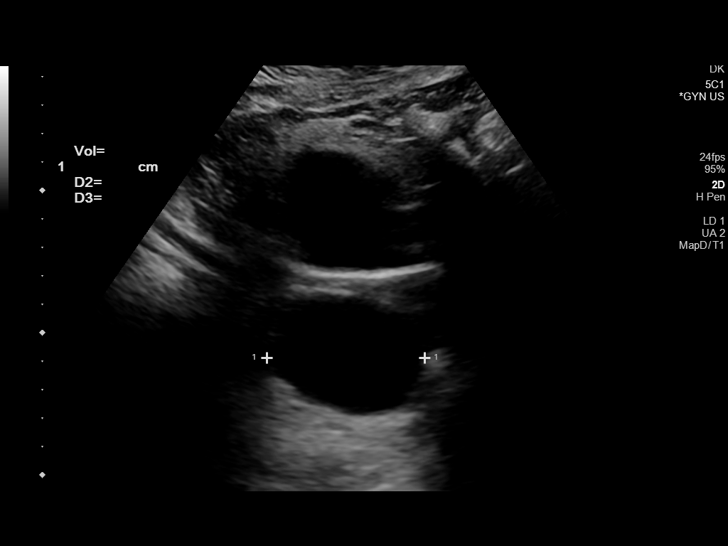
[im 10/40]
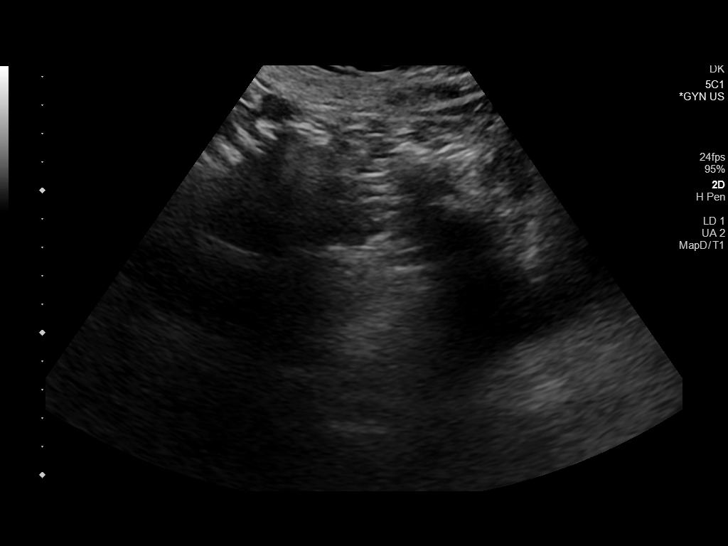
[im 14/40]
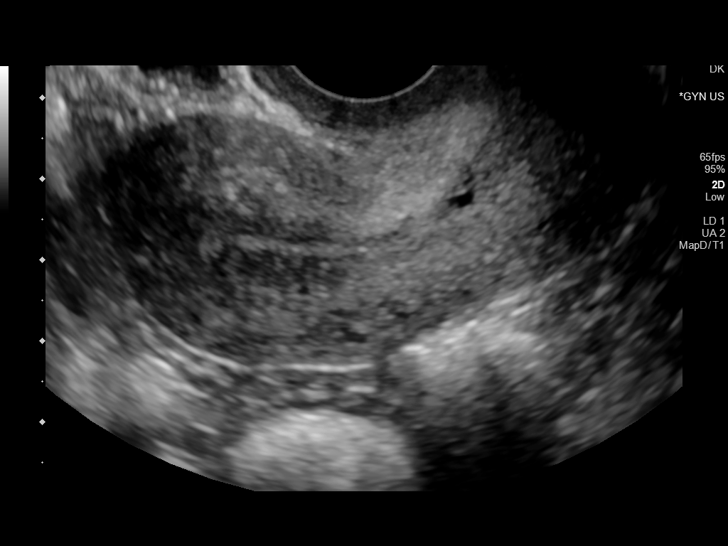
[im 17/40]
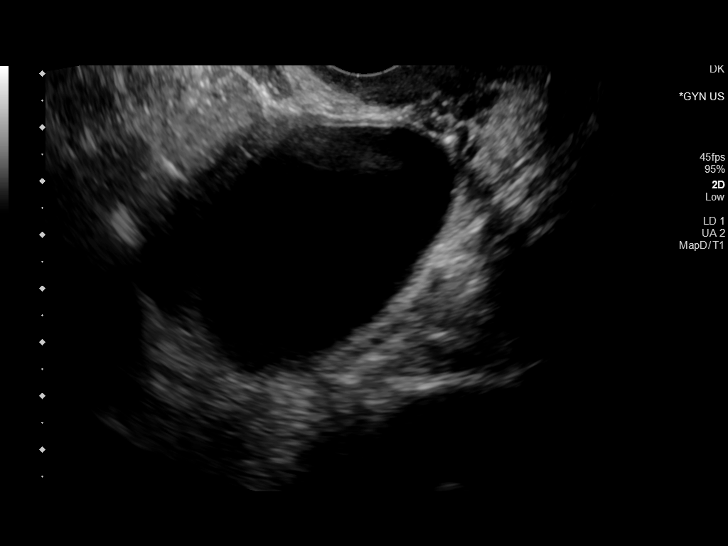
[im 20/40]
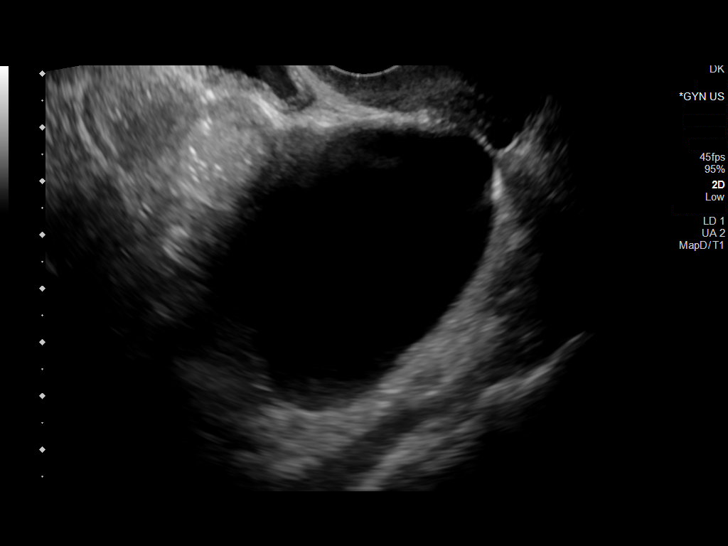
[im 23/40]
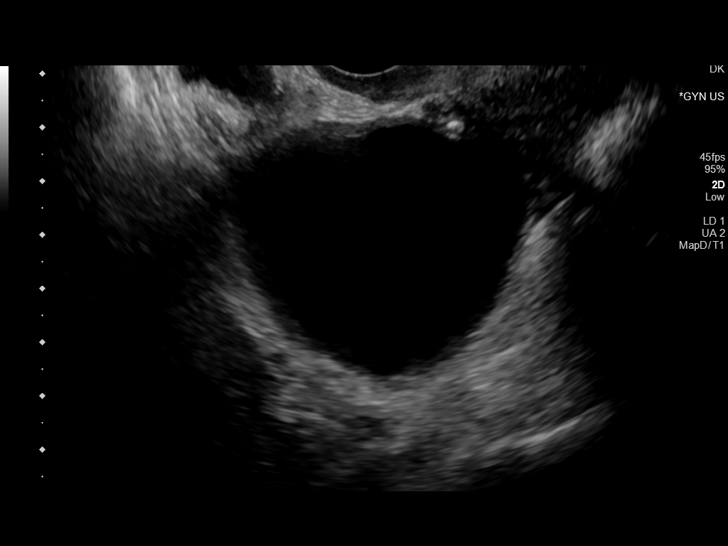
[im 27/40]
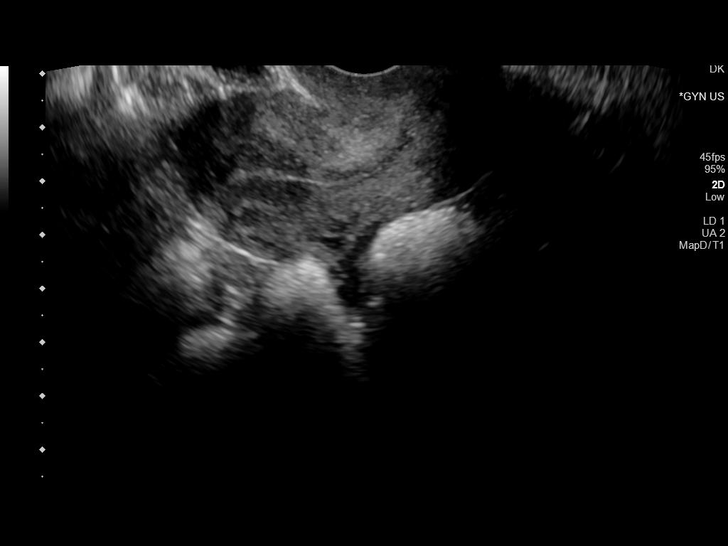
[im 30/40]
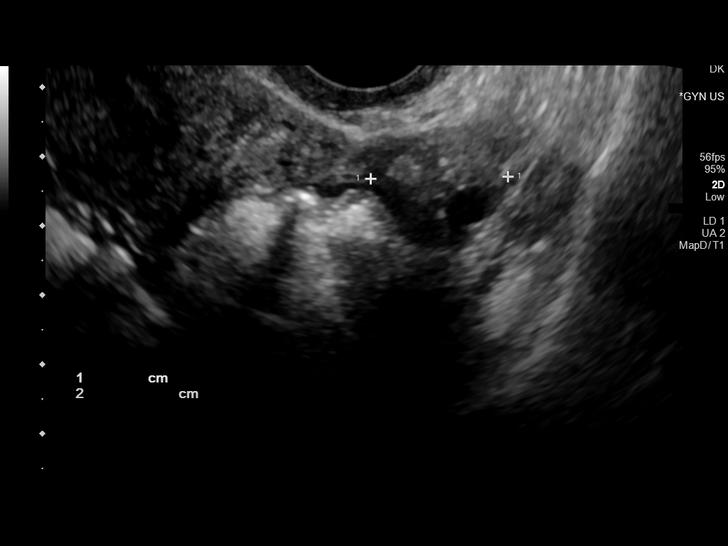
[im 33/40]
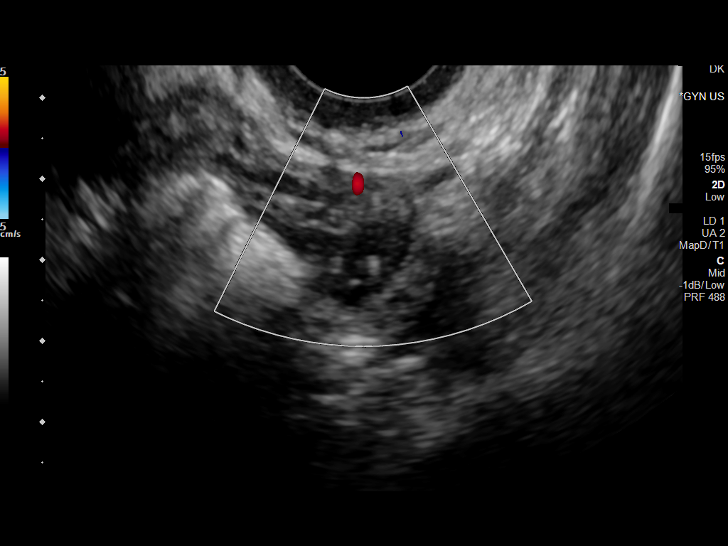
[im 36/40]
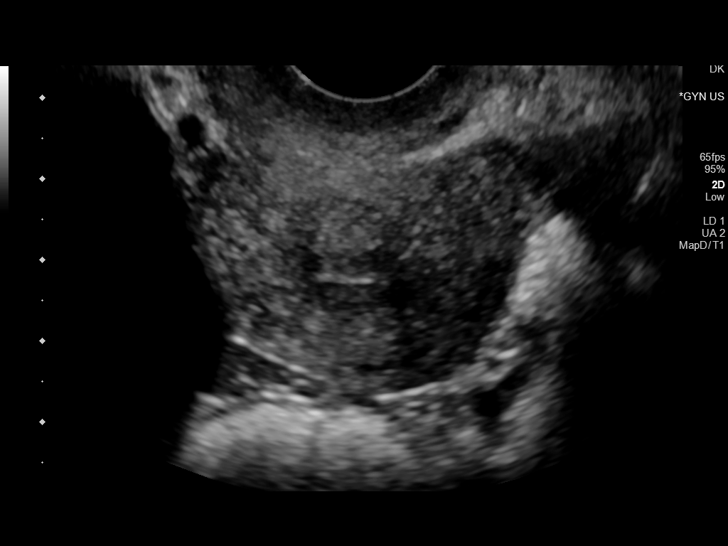
[im 40/40]
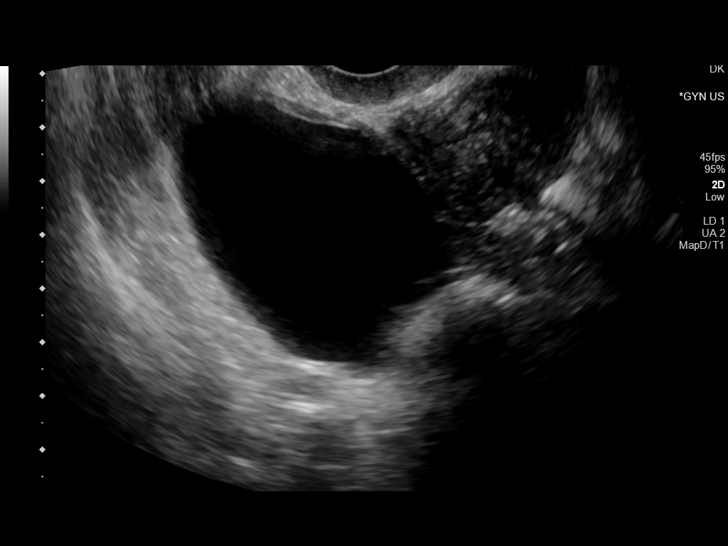

[13 of 25 positions shown; findings below may reference images not displayed]

FINDINGS: Uterus

Measurements: 7.2 x 3.0 x 3.7 cm = volume: 40 ML. Anteverted. Normal
morphology without mass

Endometrium

Thickness: 2 mm.  No endometrial fluid or focal abnormality

Right ovary

Measurements: 6.0 x 4.4 x 5.9 cm = volume: 77.9 ML. Large cyst
within RIGHT ovary 5.8 x 3.9 x 5.4 cm containing a single
nonshadowing 6 mm diameter echogenic focus at the wall. No
additional masses.

Left ovary

Measurements: 2.1 x 1.5 x 2.0 cm = volume: 3.2 mL. Normal morphology
without mass

Other findings

No free pelvic fluid.  No adnexal masses otherwise seen.
IMPRESSION: 5.8 cm cyst RIGHT ovary with a 6 mm nonshadowing echogenic focus at
the wall, could represent calcification or a hyperechoic mural
nodule.

This does not meet the criteria for a simple cyst.

Consider GYN consult and pelvis MRI w/o and w/ contrast for improved
characterization.

## 2021-08-17 DIAGNOSIS — H6123 Impacted cerumen, bilateral: Secondary | ICD-10-CM | POA: Diagnosis not present

## 2021-09-27 DIAGNOSIS — Z23 Encounter for immunization: Secondary | ICD-10-CM | POA: Diagnosis not present

## 2021-10-11 DIAGNOSIS — Z23 Encounter for immunization: Secondary | ICD-10-CM | POA: Diagnosis not present

## 2021-11-09 ENCOUNTER — Ambulatory Visit (HOSPITAL_COMMUNITY)
Admission: RE | Admit: 2021-11-09 | Discharge: 2021-11-09 | Disposition: A | Discharge status: Home / Self Care | Payer: Medicare Other | Source: Ambulatory Visit | Attending: Gynecologic Oncology | Admitting: Gynecologic Oncology

## 2021-11-09 DIAGNOSIS — N9489 Other specified conditions associated with female genital organs and menstrual cycle: Secondary | ICD-10-CM | POA: Diagnosis not present

## 2021-11-09 DIAGNOSIS — K5730 Diverticulosis of large intestine without perforation or abscess without bleeding: Secondary | ICD-10-CM | POA: Diagnosis not present

## 2021-11-09 DIAGNOSIS — Z96641 Presence of right artificial hip joint: Secondary | ICD-10-CM | POA: Diagnosis not present

## 2021-11-09 DIAGNOSIS — N83201 Unspecified ovarian cyst, right side: Secondary | ICD-10-CM | POA: Diagnosis not present

## 2021-11-09 MED ORDER — GADOBUTROL 1 MMOL/ML IV SOLN
7.5000 mL | Freq: Once | INTRAVENOUS | Status: AC | PRN
  Administered 2021-11-09: 7.5 mL via INTRAVENOUS

## 2021-11-12 ENCOUNTER — Other Ambulatory Visit: Payer: Self-pay | Admitting: Gynecologic Oncology

## 2021-11-12 ENCOUNTER — Telehealth: Payer: Self-pay | Admitting: Surgery

## 2021-11-12 DIAGNOSIS — N9489 Other specified conditions associated with female genital organs and menstrual cycle: Secondary | ICD-10-CM

## 2021-11-12 NOTE — Telephone Encounter (Signed)
Cheryl Hendricks - would you please call this patient tomorrow to get her f/u ultrasound scheduled next year? Thank you!

## 2021-11-12 NOTE — Telephone Encounter (Signed)
Patient called wanting to know her MR Pelvis results. Please advise.

## 2021-11-14 IMAGING — RF DG HIP (WITH PELVIS) OPERATIVE*R*
1 series · 3 of 3 positions shown · non-contrast
Comparison: None.

CLINICAL DATA: Right hip replacement.

EXAM:
OPERATIVE RIGHT HIP (WITH PELVIS IF PERFORMED) 3 VIEWS
TECHNIQUE: Fluoroscopic spot image(s) were submitted for interpretation
post-operatively.

[Series 1: unknown protocol · 0.20mm/px · 3 of 3 slices shown]
[im 1/3]
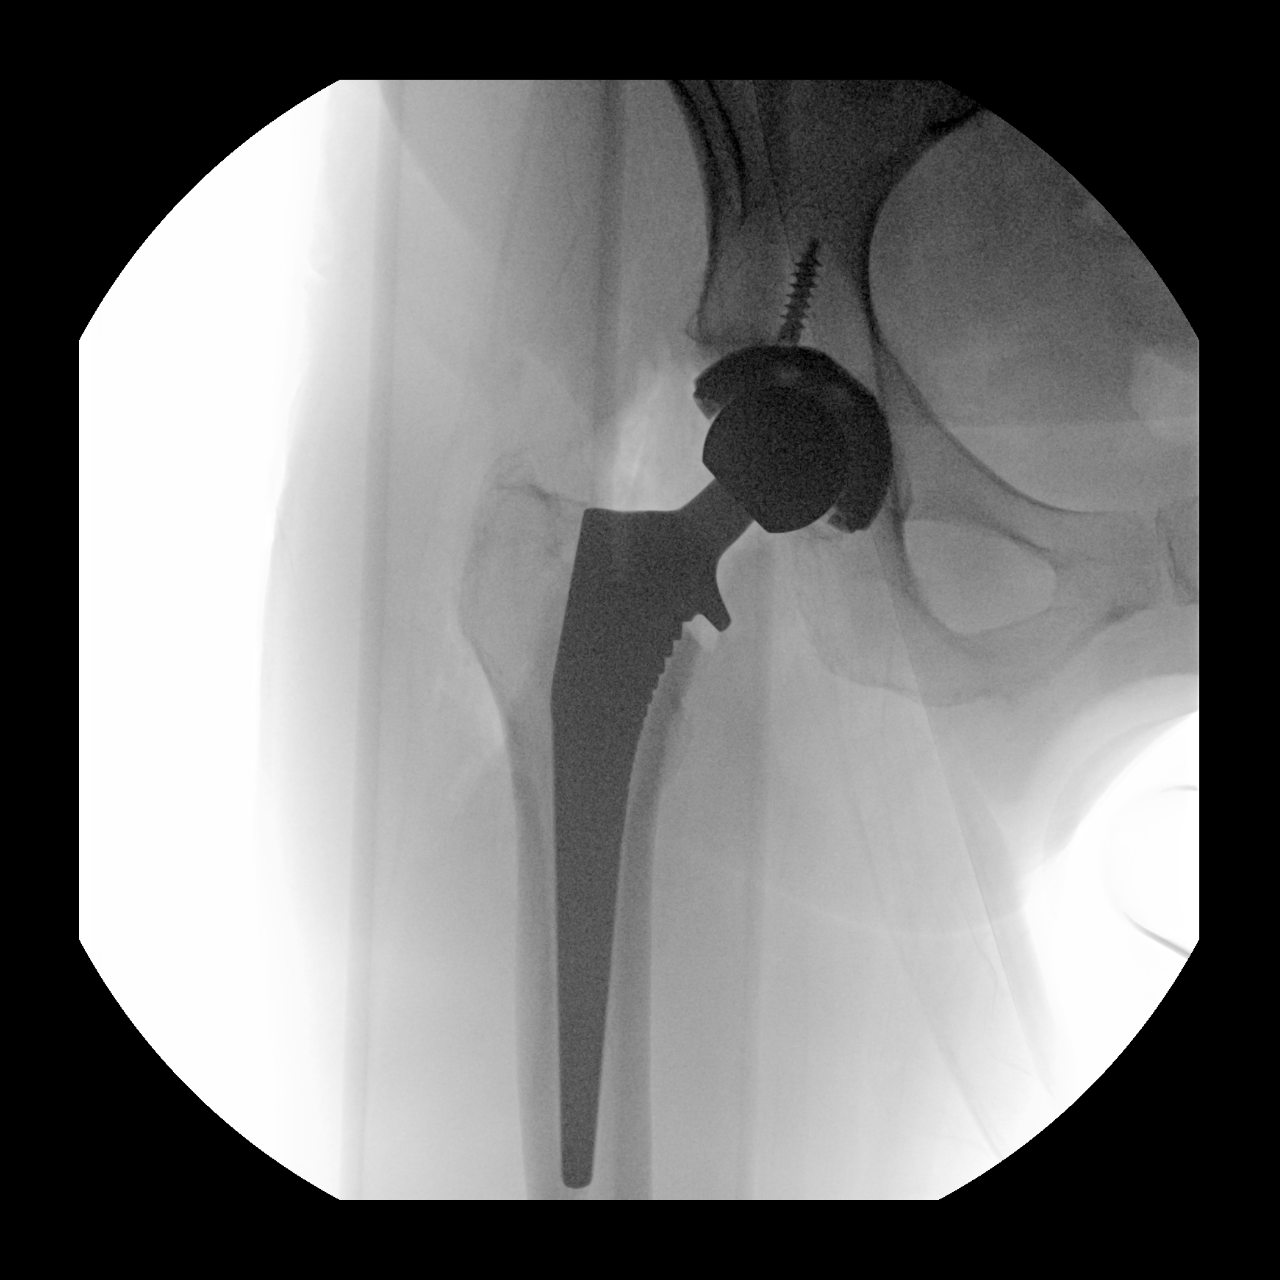
[im 2/3]
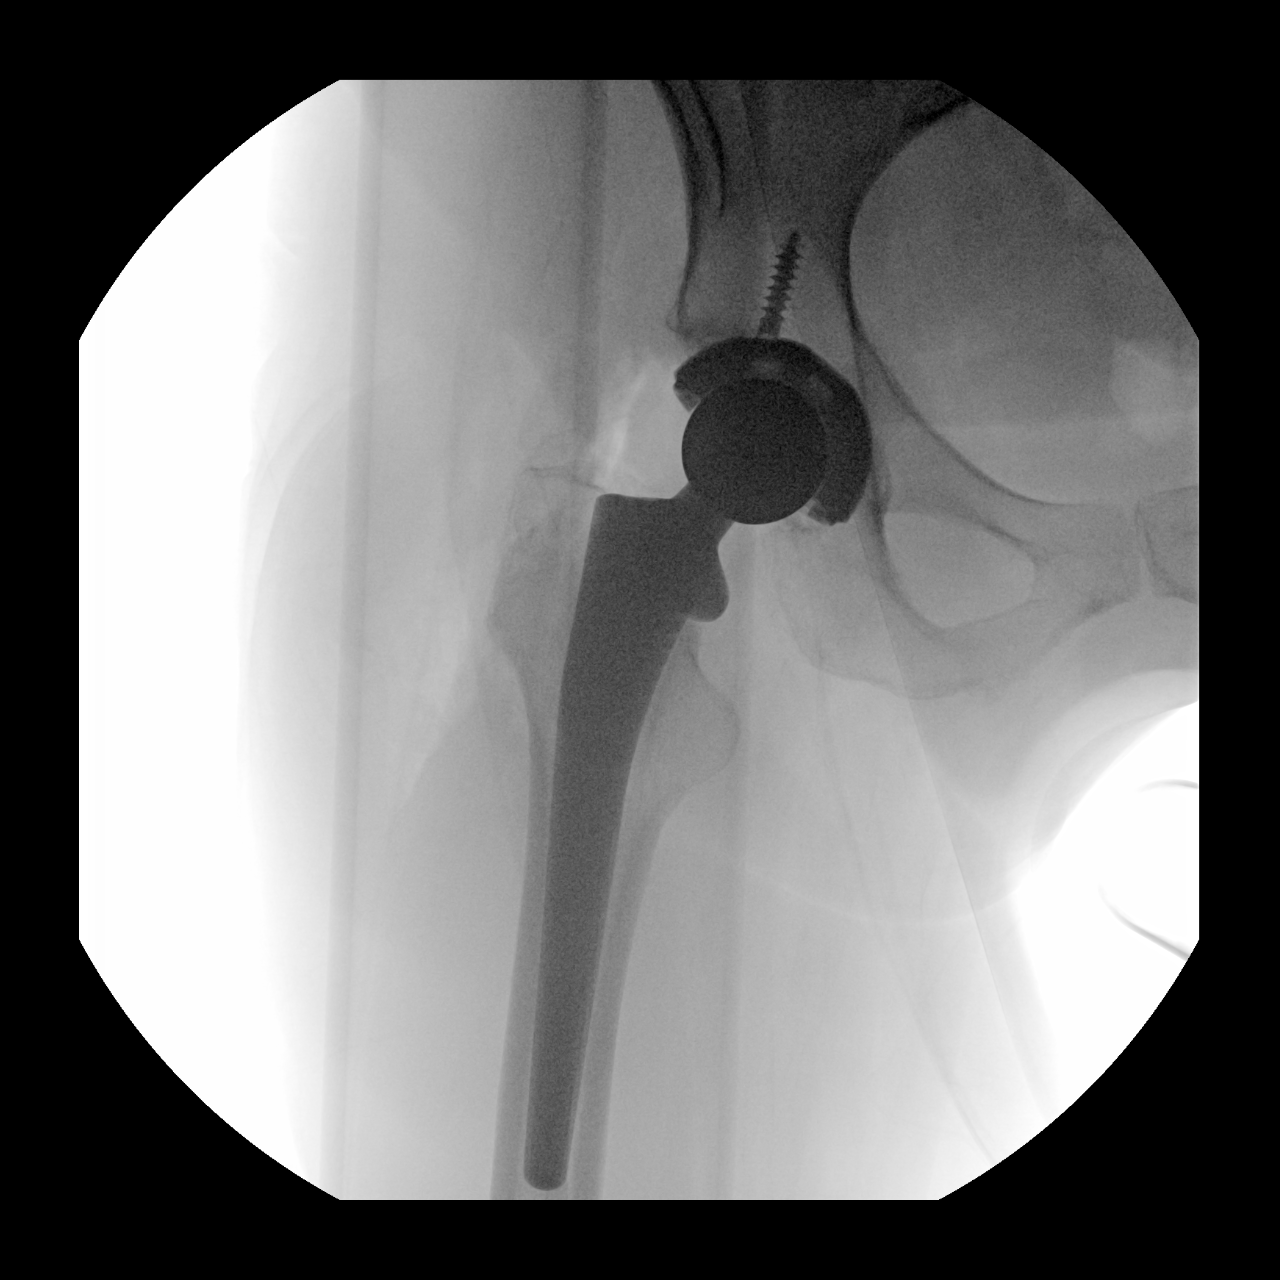
[im 3/3]
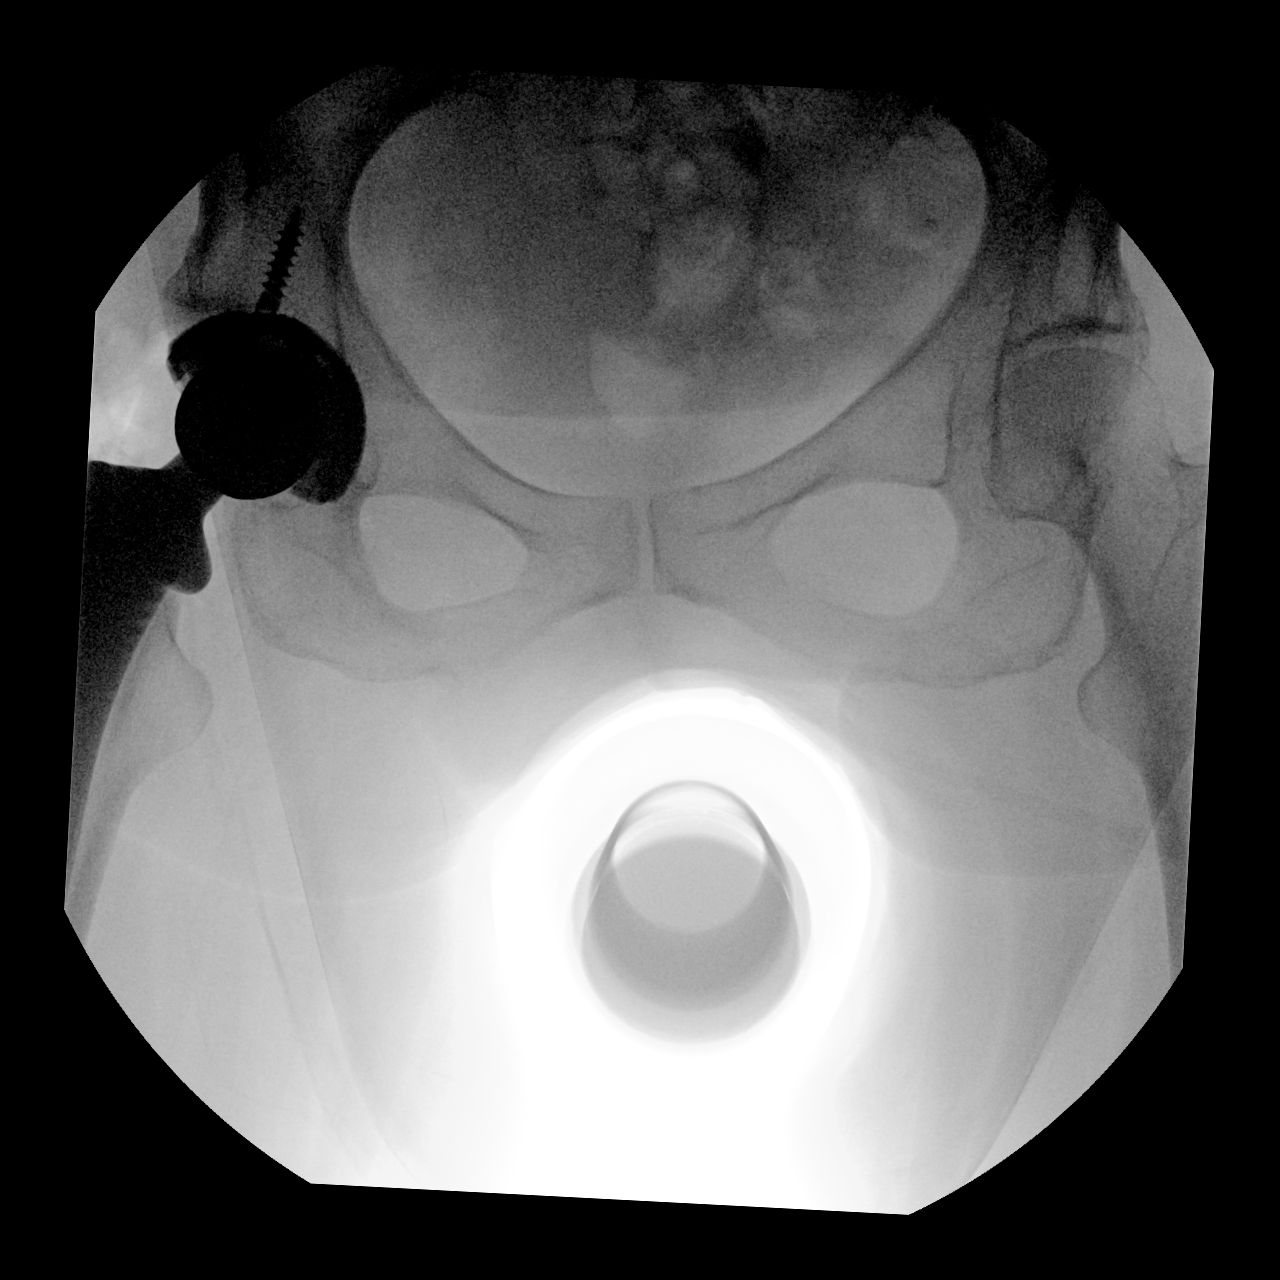

[3 of 3 positions shown; findings below may reference images not displayed]

FINDINGS: Three intraoperative spot fluoroscopic images are provided. A right
total hip arthroplasty has been performed. The prosthetic components
appear normally located without a fracture identified on these
limited images.
IMPRESSION: Intraoperative images during right hip arthroplasty.

## 2021-11-14 IMAGING — DX DG PORTABLE PELVIS
1 series · 1 of 1 positions shown · non-contrast
Comparison: Earlier the same day.

CLINICAL DATA: Right total hip replacement.

EXAM:
PORTABLE PELVIS 1-2 VIEWS

[pelvis ap]
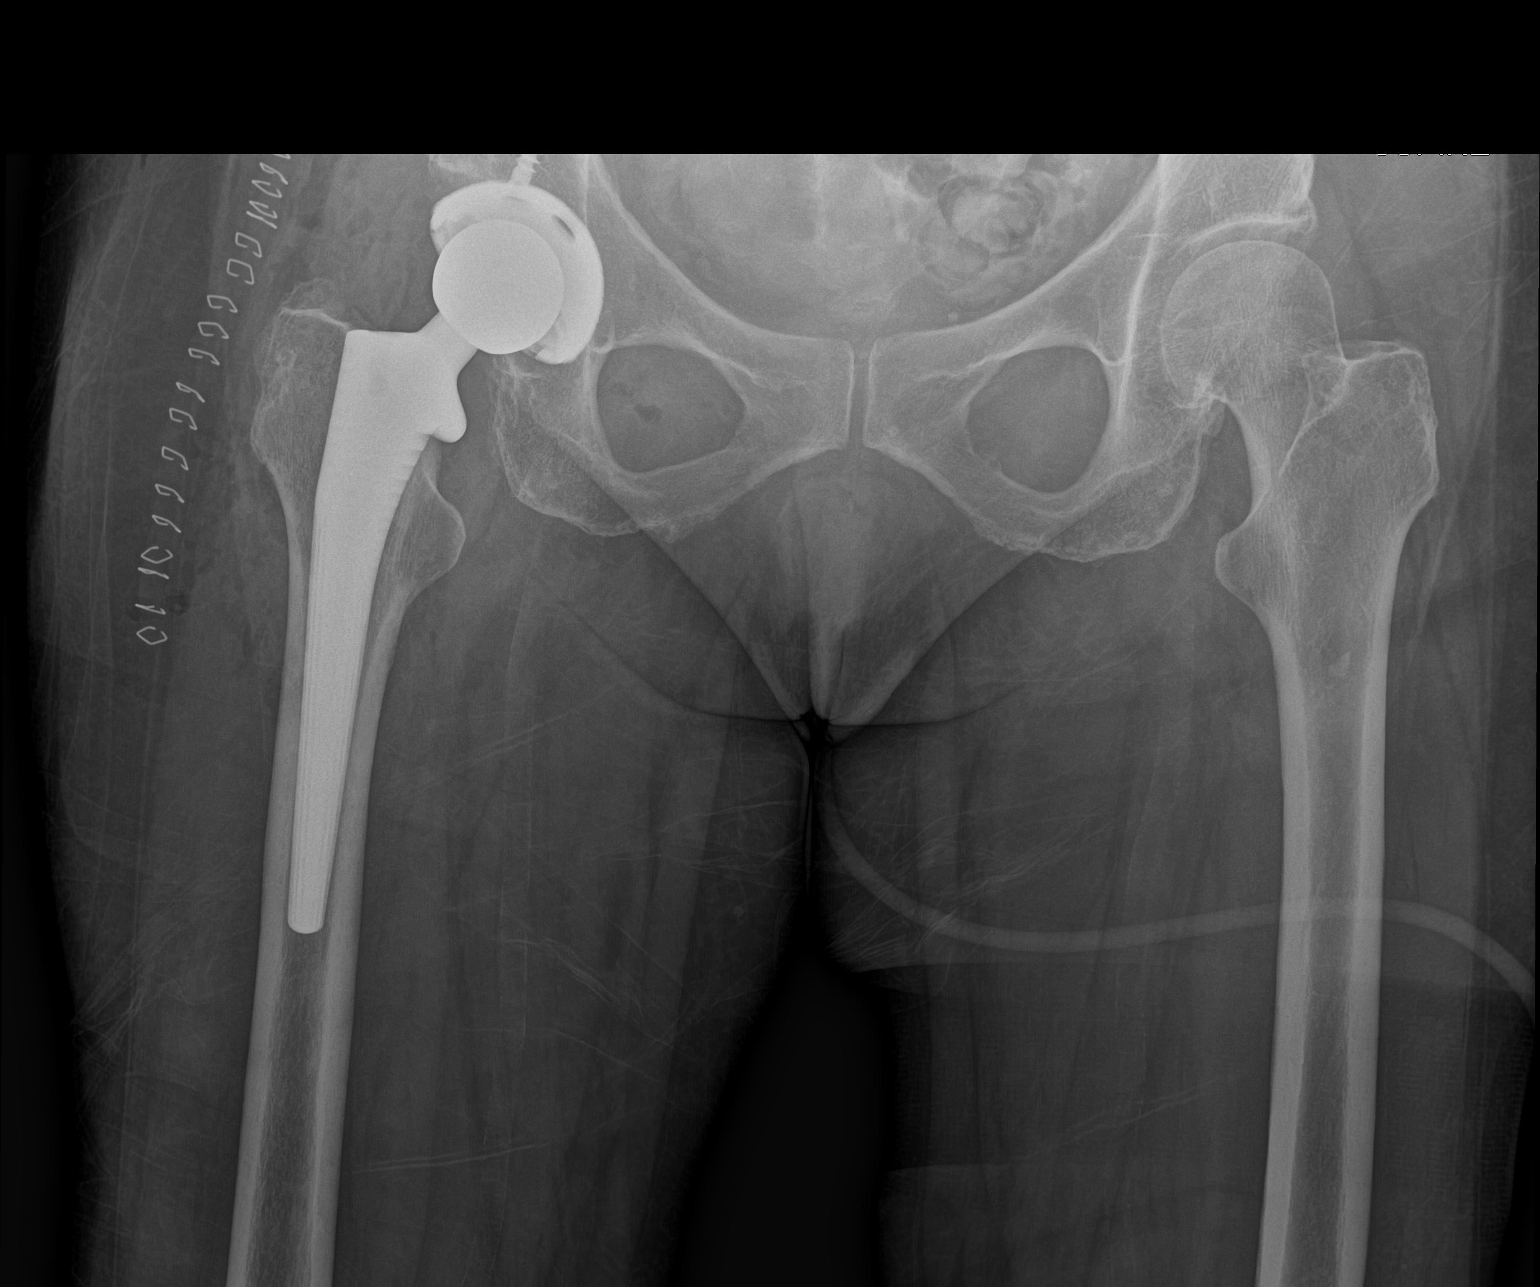

[1 of 1 positions shown; findings below may reference images not displayed]

FINDINGS: Right total hip arthroplasty. Femoral stem appears well seated.
Subcutaneous and joint air are present. Surgical skin staples are in
place. Left hip is grossly unremarkable.
IMPRESSION: Right total hip arthroplasty with expected postoperative findings.

## 2022-02-04 DIAGNOSIS — E1169 Type 2 diabetes mellitus with other specified complication: Secondary | ICD-10-CM | POA: Diagnosis not present

## 2022-02-04 DIAGNOSIS — R413 Other amnesia: Secondary | ICD-10-CM | POA: Diagnosis not present

## 2022-02-04 DIAGNOSIS — E7800 Pure hypercholesterolemia, unspecified: Secondary | ICD-10-CM | POA: Diagnosis not present

## 2022-02-04 DIAGNOSIS — I10 Essential (primary) hypertension: Secondary | ICD-10-CM | POA: Diagnosis not present

## 2022-02-04 DIAGNOSIS — Z1231 Encounter for screening mammogram for malignant neoplasm of breast: Secondary | ICD-10-CM | POA: Diagnosis not present

## 2022-02-04 DIAGNOSIS — K625 Hemorrhage of anus and rectum: Secondary | ICD-10-CM | POA: Diagnosis not present

## 2022-02-04 DIAGNOSIS — Z789 Other specified health status: Secondary | ICD-10-CM | POA: Diagnosis not present

## 2022-04-07 DIAGNOSIS — Z86010 Personal history of colonic polyps: Secondary | ICD-10-CM | POA: Diagnosis not present

## 2022-04-07 DIAGNOSIS — K5730 Diverticulosis of large intestine without perforation or abscess without bleeding: Secondary | ICD-10-CM | POA: Diagnosis not present

## 2022-04-07 DIAGNOSIS — R195 Other fecal abnormalities: Secondary | ICD-10-CM | POA: Diagnosis not present

## 2022-04-07 DIAGNOSIS — K648 Other hemorrhoids: Secondary | ICD-10-CM | POA: Diagnosis not present

## 2022-04-07 DIAGNOSIS — D120 Benign neoplasm of cecum: Secondary | ICD-10-CM | POA: Diagnosis not present

## 2022-04-13 DIAGNOSIS — D120 Benign neoplasm of cecum: Secondary | ICD-10-CM | POA: Diagnosis not present

## 2022-04-13 DIAGNOSIS — E119 Type 2 diabetes mellitus without complications: Secondary | ICD-10-CM | POA: Diagnosis not present

## 2022-05-18 DIAGNOSIS — Z23 Encounter for immunization: Secondary | ICD-10-CM | POA: Diagnosis not present

## 2022-06-02 ENCOUNTER — Other Ambulatory Visit: Payer: Self-pay | Admitting: Internal Medicine

## 2022-06-02 DIAGNOSIS — Z1231 Encounter for screening mammogram for malignant neoplasm of breast: Secondary | ICD-10-CM

## 2022-07-12 ENCOUNTER — Ambulatory Visit
Admission: RE | Admit: 2022-07-12 | Discharge: 2022-07-12 | Disposition: A | Discharge status: Home / Self Care | Payer: Medicare Other | Source: Ambulatory Visit | Attending: Internal Medicine | Admitting: Internal Medicine

## 2022-07-12 DIAGNOSIS — Z1231 Encounter for screening mammogram for malignant neoplasm of breast: Secondary | ICD-10-CM

## 2022-08-19 DIAGNOSIS — N83201 Unspecified ovarian cyst, right side: Secondary | ICD-10-CM | POA: Diagnosis not present

## 2022-08-19 DIAGNOSIS — E1169 Type 2 diabetes mellitus with other specified complication: Secondary | ICD-10-CM | POA: Diagnosis not present

## 2022-08-19 DIAGNOSIS — Z0000 Encounter for general adult medical examination without abnormal findings: Secondary | ICD-10-CM | POA: Diagnosis not present

## 2022-08-19 DIAGNOSIS — Z1389 Encounter for screening for other disorder: Secondary | ICD-10-CM | POA: Diagnosis not present

## 2022-08-19 DIAGNOSIS — E7800 Pure hypercholesterolemia, unspecified: Secondary | ICD-10-CM | POA: Diagnosis not present

## 2022-08-19 DIAGNOSIS — I10 Essential (primary) hypertension: Secondary | ICD-10-CM | POA: Diagnosis not present

## 2022-08-19 DIAGNOSIS — M8580 Other specified disorders of bone density and structure, unspecified site: Secondary | ICD-10-CM | POA: Diagnosis not present

## 2022-08-19 DIAGNOSIS — R002 Palpitations: Secondary | ICD-10-CM | POA: Diagnosis not present

## 2022-08-19 DIAGNOSIS — N9489 Other specified conditions associated with female genital organs and menstrual cycle: Secondary | ICD-10-CM | POA: Diagnosis not present

## 2022-08-27 ENCOUNTER — Other Ambulatory Visit: Payer: Self-pay | Admitting: Internal Medicine

## 2022-08-27 DIAGNOSIS — M8580 Other specified disorders of bone density and structure, unspecified site: Secondary | ICD-10-CM

## 2022-09-09 DIAGNOSIS — R002 Palpitations: Secondary | ICD-10-CM | POA: Diagnosis not present

## 2022-09-14 DIAGNOSIS — Z23 Encounter for immunization: Secondary | ICD-10-CM | POA: Diagnosis not present

## 2022-09-14 DIAGNOSIS — R002 Palpitations: Secondary | ICD-10-CM | POA: Diagnosis not present

## 2022-10-18 ENCOUNTER — Ambulatory Visit (HOSPITAL_COMMUNITY)
Admission: RE | Admit: 2022-10-18 | Discharge: 2022-10-18 | Disposition: A | Discharge status: Home / Self Care | Payer: Medicare Other | Source: Ambulatory Visit | Attending: Gynecologic Oncology | Admitting: Gynecologic Oncology

## 2022-10-18 DIAGNOSIS — N9489 Other specified conditions associated with female genital organs and menstrual cycle: Secondary | ICD-10-CM | POA: Diagnosis not present

## 2022-10-18 DIAGNOSIS — N83201 Unspecified ovarian cyst, right side: Secondary | ICD-10-CM | POA: Diagnosis not present

## 2022-11-02 ENCOUNTER — Telehealth: Payer: Self-pay | Admitting: Gynecologic Oncology

## 2022-11-02 ENCOUNTER — Telehealth: Payer: Self-pay | Admitting: *Deleted

## 2022-11-02 DIAGNOSIS — N9489 Other specified conditions associated with female genital organs and menstrual cycle: Secondary | ICD-10-CM

## 2022-11-02 NOTE — Telephone Encounter (Signed)
Spoke with Ms. Seccombe who called the office returning Dr. Winferd Humphrey call in regards to her stable ultrasound results. Relayed message from Dr. Winferd Humphrey note that given stable size of cyst, she can continue with yearly imaging with an ultrasound to follow this a little longer to assure no change in size. Ultrasound was ordered. Pt verbalized understanding and agreed to have imaging done.  Pelvic Ultrasound ordered at [facility] for October 31, 2023 at 1100, with arrival time of 1030, pt to arrive with full bladder. Pt notified and aware of future appointment.

## 2022-11-02 NOTE — Telephone Encounter (Signed)
Called patient to discuss recent ultrasound. Stable findings. I asked patient to call the office when she gets my message. Given stable size of the cyst, we can continue to get yearly imaging with an ultrasound to follow this a little longer to assure no change in size. I'm ordering an ultrasound if she is interested in doing that.   Eugene Garnet MD Gynecologic Oncology

## 2023-01-10 DIAGNOSIS — H6122 Impacted cerumen, left ear: Secondary | ICD-10-CM | POA: Diagnosis not present

## 2023-01-21 ENCOUNTER — Ambulatory Visit
Admission: RE | Admit: 2023-01-21 | Discharge: 2023-01-21 | Disposition: A | Discharge status: Home / Self Care | Payer: Medicare Other | Source: Ambulatory Visit | Attending: Internal Medicine | Admitting: Internal Medicine

## 2023-01-21 DIAGNOSIS — M8588 Other specified disorders of bone density and structure, other site: Secondary | ICD-10-CM | POA: Diagnosis not present

## 2023-01-21 DIAGNOSIS — M8580 Other specified disorders of bone density and structure, unspecified site: Secondary | ICD-10-CM

## 2023-01-21 DIAGNOSIS — N958 Other specified menopausal and perimenopausal disorders: Secondary | ICD-10-CM | POA: Diagnosis not present

## 2023-01-21 DIAGNOSIS — E2839 Other primary ovarian failure: Secondary | ICD-10-CM | POA: Diagnosis not present

## 2023-02-21 DIAGNOSIS — I10 Essential (primary) hypertension: Secondary | ICD-10-CM | POA: Diagnosis not present

## 2023-02-21 DIAGNOSIS — M25512 Pain in left shoulder: Secondary | ICD-10-CM | POA: Diagnosis not present

## 2023-02-21 DIAGNOSIS — E7800 Pure hypercholesterolemia, unspecified: Secondary | ICD-10-CM | POA: Diagnosis not present

## 2023-02-21 DIAGNOSIS — E1169 Type 2 diabetes mellitus with other specified complication: Secondary | ICD-10-CM | POA: Diagnosis not present

## 2023-02-21 DIAGNOSIS — M8580 Other specified disorders of bone density and structure, unspecified site: Secondary | ICD-10-CM | POA: Diagnosis not present

## 2023-02-21 DIAGNOSIS — I493 Ventricular premature depolarization: Secondary | ICD-10-CM | POA: Diagnosis not present

## 2023-04-04 DIAGNOSIS — Z23 Encounter for immunization: Secondary | ICD-10-CM | POA: Diagnosis not present

## 2023-04-18 DIAGNOSIS — E119 Type 2 diabetes mellitus without complications: Secondary | ICD-10-CM | POA: Diagnosis not present

## 2023-05-11 DIAGNOSIS — E1169 Type 2 diabetes mellitus with other specified complication: Secondary | ICD-10-CM | POA: Diagnosis not present

## 2023-05-11 DIAGNOSIS — E7800 Pure hypercholesterolemia, unspecified: Secondary | ICD-10-CM | POA: Diagnosis not present

## 2023-05-11 DIAGNOSIS — I10 Essential (primary) hypertension: Secondary | ICD-10-CM | POA: Diagnosis not present

## 2023-06-07 ENCOUNTER — Other Ambulatory Visit: Payer: Self-pay | Admitting: Internal Medicine

## 2023-06-07 DIAGNOSIS — Z1231 Encounter for screening mammogram for malignant neoplasm of breast: Secondary | ICD-10-CM

## 2023-06-11 DIAGNOSIS — I10 Essential (primary) hypertension: Secondary | ICD-10-CM | POA: Diagnosis not present

## 2023-06-11 DIAGNOSIS — E7800 Pure hypercholesterolemia, unspecified: Secondary | ICD-10-CM | POA: Diagnosis not present

## 2023-06-11 DIAGNOSIS — E1169 Type 2 diabetes mellitus with other specified complication: Secondary | ICD-10-CM | POA: Diagnosis not present

## 2023-06-29 DIAGNOSIS — L821 Other seborrheic keratosis: Secondary | ICD-10-CM | POA: Diagnosis not present

## 2023-07-26 ENCOUNTER — Ambulatory Visit
Admission: RE | Admit: 2023-07-26 | Discharge: 2023-07-26 | Disposition: A | Discharge status: Home / Self Care | Source: Ambulatory Visit | Attending: Internal Medicine | Admitting: Internal Medicine

## 2023-07-26 DIAGNOSIS — Z1231 Encounter for screening mammogram for malignant neoplasm of breast: Secondary | ICD-10-CM | POA: Diagnosis not present

## 2023-08-15 ENCOUNTER — Ambulatory Visit (HOSPITAL_COMMUNITY)
Admission: RE | Admit: 2023-08-15 | Discharge: 2023-08-15 | Disposition: A | Discharge status: Home / Self Care | Source: Ambulatory Visit | Attending: Gynecologic Oncology | Admitting: Gynecologic Oncology

## 2023-08-15 ENCOUNTER — Telehealth: Payer: Self-pay

## 2023-08-15 DIAGNOSIS — N838 Other noninflammatory disorders of ovary, fallopian tube and broad ligament: Secondary | ICD-10-CM | POA: Diagnosis not present

## 2023-08-15 DIAGNOSIS — N9489 Other specified conditions associated with female genital organs and menstrual cycle: Secondary | ICD-10-CM | POA: Diagnosis not present

## 2023-08-15 NOTE — Telephone Encounter (Signed)
 [name] stopped by the office today. She wanted Dr.Tucker to know she rescheduled the ultrasound from October to this morning. She is moving to [place]  in September to be closer to her daughter.   She is asking if Dr.Tucker knows of anyone in the [place] or [place]  area?  She is thankful for all of the great care she has received with our clinic and will wait for a call with results of ultrasound.

## 2023-08-22 DIAGNOSIS — H6122 Impacted cerumen, left ear: Secondary | ICD-10-CM | POA: Diagnosis not present

## 2023-08-22 DIAGNOSIS — Z23 Encounter for immunization: Secondary | ICD-10-CM | POA: Diagnosis not present

## 2023-08-22 DIAGNOSIS — E7800 Pure hypercholesterolemia, unspecified: Secondary | ICD-10-CM | POA: Diagnosis not present

## 2023-08-22 DIAGNOSIS — Z0000 Encounter for general adult medical examination without abnormal findings: Secondary | ICD-10-CM | POA: Diagnosis not present

## 2023-08-22 DIAGNOSIS — I493 Ventricular premature depolarization: Secondary | ICD-10-CM | POA: Diagnosis not present

## 2023-08-22 DIAGNOSIS — M8580 Other specified disorders of bone density and structure, unspecified site: Secondary | ICD-10-CM | POA: Diagnosis not present

## 2023-08-22 DIAGNOSIS — K219 Gastro-esophageal reflux disease without esophagitis: Secondary | ICD-10-CM | POA: Diagnosis not present

## 2023-08-22 DIAGNOSIS — I10 Essential (primary) hypertension: Secondary | ICD-10-CM | POA: Diagnosis not present

## 2023-08-22 DIAGNOSIS — E1169 Type 2 diabetes mellitus with other specified complication: Secondary | ICD-10-CM | POA: Diagnosis not present

## 2023-08-22 DIAGNOSIS — N83201 Unspecified ovarian cyst, right side: Secondary | ICD-10-CM | POA: Diagnosis not present

## 2023-08-22 DIAGNOSIS — Z1331 Encounter for screening for depression: Secondary | ICD-10-CM | POA: Diagnosis not present

## 2023-08-22 NOTE — Telephone Encounter (Signed)
 Pt is aware of message from Dr.Tucker, she voices an understanding and states she will wait for Dr.Tucker to call her regarding the ultrasound results.

## 2023-08-26 ENCOUNTER — Telehealth: Payer: Self-pay | Admitting: Gynecologic Oncology

## 2023-08-26 ENCOUNTER — Ambulatory Visit: Payer: Self-pay | Admitting: Gynecologic Oncology

## 2023-08-26 NOTE — Telephone Encounter (Signed)
 Called the patient to discuss ultrasound results. No answer. Left message with callback requested.  If the patient calls, please let her know that mass on the ovary looks very stable in size and character.  This is overall reassuring as it has been multiple years now without any change.  The patient still would like to avoid surgery, I would recommend a follow-up ultrasound in 1 year.  This could be done with a gynecology office in [place] or [place].  Comer Dollar MD Gynecologic Oncology

## 2023-08-29 NOTE — Telephone Encounter (Signed)
 The patient called back today. And I  let her know, as Dr. Lewie noted stated,  that mass on the ovary looks very stable in size and character.  This is overall reassuring as it has been multiple years now without any change.  The patient still would like to avoid surgery, I would recommend a follow-up ultrasound in 1 year.  This could be done with a gynecology office in [place] or [place].

## 2023-10-31 ENCOUNTER — Telehealth: Payer: Self-pay

## 2023-10-31 ENCOUNTER — Ambulatory Visit (HOSPITAL_COMMUNITY): Payer: Medicare Other

## 2023-10-31 NOTE — Telephone Encounter (Signed)
 Our office received a records request from [facility] in [place] . Requested records from Dr.Tucker with visit dates of 2020 through 08/2023.   Requested records provided and faxed to number on request: [phone]  I spoke to Ms.Buehner, she is aware of records being faxed to [facility]. She states she relocated to [place]  to be closer to family. She wanted me to tell Dr.Tucker she will miss us  all and is grateful for the wonderful care.
